# Patient Record
Sex: Male | Born: 1940 | Race: White | Hispanic: No | Marital: Married | Smoking: Never smoker
Health system: Southern US, Community
[De-identification: ages and names within clinical notes are randomized; demographics above are authoritative.]

## PROBLEM LIST (undated history)

## (undated) DIAGNOSIS — E119 Type 2 diabetes mellitus without complications: Secondary | ICD-10-CM

## (undated) DIAGNOSIS — I2510 Atherosclerotic heart disease of native coronary artery without angina pectoris: Principal | ICD-10-CM

## (undated) DIAGNOSIS — E785 Hyperlipidemia, unspecified: Secondary | ICD-10-CM

## (undated) DIAGNOSIS — I200 Unstable angina: Secondary | ICD-10-CM

## (undated) DIAGNOSIS — I10 Essential (primary) hypertension: Secondary | ICD-10-CM

## (undated) DIAGNOSIS — R079 Chest pain, unspecified: Secondary | ICD-10-CM

## (undated) HISTORY — PX: APPENDECTOMY: SHX54

## (undated) HISTORY — PX: HERNIA REPAIR: SHX51

## (undated) HISTORY — DX: Unstable angina: I20.0

## (undated) HISTORY — PX: CARDIAC CATHETERIZATION: SHX172

## (undated) HISTORY — DX: Chest pain, unspecified: R07.9

## (undated) HISTORY — DX: Hyperlipidemia, unspecified: E78.5

## (undated) HISTORY — DX: Essential (primary) hypertension: I10

## (undated) HISTORY — PX: TONSILLECTOMY: SUR1361

## (undated) HISTORY — DX: Type 2 diabetes mellitus without complications: E11.9

## (undated) HISTORY — DX: Atherosclerotic heart disease of native coronary artery without angina pectoris: I25.10

---

## 2015-03-14 DIAGNOSIS — D510 Vitamin B12 deficiency anemia due to intrinsic factor deficiency: Secondary | ICD-10-CM | POA: Diagnosis not present

## 2015-03-21 DIAGNOSIS — J101 Influenza due to other identified influenza virus with other respiratory manifestations: Secondary | ICD-10-CM | POA: Diagnosis not present

## 2015-05-03 DIAGNOSIS — D510 Vitamin B12 deficiency anemia due to intrinsic factor deficiency: Secondary | ICD-10-CM | POA: Diagnosis not present

## 2015-06-06 DIAGNOSIS — D510 Vitamin B12 deficiency anemia due to intrinsic factor deficiency: Secondary | ICD-10-CM | POA: Diagnosis not present

## 2015-06-27 DIAGNOSIS — N410 Acute prostatitis: Secondary | ICD-10-CM | POA: Diagnosis not present

## 2015-06-27 DIAGNOSIS — N3020 Other chronic cystitis without hematuria: Secondary | ICD-10-CM | POA: Diagnosis not present

## 2015-07-06 DIAGNOSIS — D510 Vitamin B12 deficiency anemia due to intrinsic factor deficiency: Secondary | ICD-10-CM | POA: Diagnosis not present

## 2015-08-10 DIAGNOSIS — D510 Vitamin B12 deficiency anemia due to intrinsic factor deficiency: Secondary | ICD-10-CM | POA: Diagnosis not present

## 2015-08-18 DIAGNOSIS — Z1389 Encounter for screening for other disorder: Secondary | ICD-10-CM | POA: Diagnosis not present

## 2015-08-18 DIAGNOSIS — M67911 Unspecified disorder of synovium and tendon, right shoulder: Secondary | ICD-10-CM | POA: Diagnosis not present

## 2015-08-18 DIAGNOSIS — R079 Chest pain, unspecified: Secondary | ICD-10-CM | POA: Diagnosis not present

## 2015-08-18 DIAGNOSIS — Z9181 History of falling: Secondary | ICD-10-CM | POA: Diagnosis not present

## 2015-08-24 DIAGNOSIS — Z7982 Long term (current) use of aspirin: Secondary | ICD-10-CM | POA: Diagnosis not present

## 2015-08-24 DIAGNOSIS — E039 Hypothyroidism, unspecified: Secondary | ICD-10-CM | POA: Diagnosis not present

## 2015-08-24 DIAGNOSIS — E7800 Pure hypercholesterolemia, unspecified: Secondary | ICD-10-CM | POA: Diagnosis not present

## 2015-08-24 DIAGNOSIS — R079 Chest pain, unspecified: Secondary | ICD-10-CM | POA: Diagnosis not present

## 2015-08-24 DIAGNOSIS — I2510 Atherosclerotic heart disease of native coronary artery without angina pectoris: Secondary | ICD-10-CM | POA: Diagnosis not present

## 2015-08-24 DIAGNOSIS — Z7984 Long term (current) use of oral hypoglycemic drugs: Secondary | ICD-10-CM | POA: Diagnosis not present

## 2015-08-24 DIAGNOSIS — E785 Hyperlipidemia, unspecified: Secondary | ICD-10-CM | POA: Diagnosis not present

## 2015-08-24 DIAGNOSIS — Z79899 Other long term (current) drug therapy: Secondary | ICD-10-CM | POA: Diagnosis not present

## 2015-08-24 DIAGNOSIS — E119 Type 2 diabetes mellitus without complications: Secondary | ICD-10-CM | POA: Diagnosis not present

## 2015-08-24 DIAGNOSIS — I700 Atherosclerosis of aorta: Secondary | ICD-10-CM | POA: Diagnosis not present

## 2015-08-24 DIAGNOSIS — E784 Other hyperlipidemia: Secondary | ICD-10-CM | POA: Diagnosis not present

## 2015-08-24 DIAGNOSIS — I200 Unstable angina: Secondary | ICD-10-CM | POA: Diagnosis not present

## 2015-08-24 DIAGNOSIS — I209 Angina pectoris, unspecified: Secondary | ICD-10-CM | POA: Diagnosis not present

## 2015-08-24 DIAGNOSIS — Z881 Allergy status to other antibiotic agents status: Secondary | ICD-10-CM | POA: Diagnosis not present

## 2015-08-25 DIAGNOSIS — Z7982 Long term (current) use of aspirin: Secondary | ICD-10-CM | POA: Diagnosis not present

## 2015-08-25 DIAGNOSIS — E039 Hypothyroidism, unspecified: Secondary | ICD-10-CM | POA: Diagnosis not present

## 2015-08-25 DIAGNOSIS — Z7984 Long term (current) use of oral hypoglycemic drugs: Secondary | ICD-10-CM | POA: Diagnosis not present

## 2015-08-25 DIAGNOSIS — E119 Type 2 diabetes mellitus without complications: Secondary | ICD-10-CM | POA: Diagnosis not present

## 2015-08-25 DIAGNOSIS — E785 Hyperlipidemia, unspecified: Secondary | ICD-10-CM | POA: Diagnosis not present

## 2015-08-25 DIAGNOSIS — I200 Unstable angina: Secondary | ICD-10-CM | POA: Diagnosis not present

## 2015-08-25 DIAGNOSIS — R079 Chest pain, unspecified: Secondary | ICD-10-CM

## 2015-08-25 DIAGNOSIS — I2510 Atherosclerotic heart disease of native coronary artery without angina pectoris: Secondary | ICD-10-CM | POA: Diagnosis not present

## 2015-08-25 DIAGNOSIS — E784 Other hyperlipidemia: Secondary | ICD-10-CM | POA: Diagnosis not present

## 2015-08-25 DIAGNOSIS — Z79899 Other long term (current) drug therapy: Secondary | ICD-10-CM | POA: Diagnosis not present

## 2015-08-25 HISTORY — DX: Chest pain, unspecified: R07.9

## 2015-08-31 DIAGNOSIS — R079 Chest pain, unspecified: Secondary | ICD-10-CM | POA: Insufficient documentation

## 2015-08-31 DIAGNOSIS — I200 Unstable angina: Secondary | ICD-10-CM | POA: Insufficient documentation

## 2015-08-31 DIAGNOSIS — E785 Hyperlipidemia, unspecified: Secondary | ICD-10-CM

## 2015-08-31 DIAGNOSIS — E119 Type 2 diabetes mellitus without complications: Secondary | ICD-10-CM

## 2015-08-31 HISTORY — DX: Type 2 diabetes mellitus without complications: E11.9

## 2015-08-31 HISTORY — DX: Hyperlipidemia, unspecified: E78.5

## 2015-08-31 HISTORY — DX: Unstable angina: I20.0

## 2015-09-01 DIAGNOSIS — I200 Unstable angina: Secondary | ICD-10-CM | POA: Diagnosis not present

## 2015-09-13 DIAGNOSIS — D510 Vitamin B12 deficiency anemia due to intrinsic factor deficiency: Secondary | ICD-10-CM | POA: Diagnosis not present

## 2015-09-29 DIAGNOSIS — E039 Hypothyroidism, unspecified: Secondary | ICD-10-CM | POA: Diagnosis not present

## 2015-09-29 DIAGNOSIS — E1165 Type 2 diabetes mellitus with hyperglycemia: Secondary | ICD-10-CM | POA: Diagnosis not present

## 2015-09-29 DIAGNOSIS — Z125 Encounter for screening for malignant neoplasm of prostate: Secondary | ICD-10-CM | POA: Diagnosis not present

## 2015-09-29 DIAGNOSIS — E782 Mixed hyperlipidemia: Secondary | ICD-10-CM | POA: Diagnosis not present

## 2015-09-30 DIAGNOSIS — S83401A Sprain of unspecified collateral ligament of right knee, initial encounter: Secondary | ICD-10-CM | POA: Diagnosis not present

## 2015-09-30 DIAGNOSIS — M67911 Unspecified disorder of synovium and tendon, right shoulder: Secondary | ICD-10-CM | POA: Diagnosis not present

## 2015-10-06 DIAGNOSIS — E1165 Type 2 diabetes mellitus with hyperglycemia: Secondary | ICD-10-CM | POA: Diagnosis not present

## 2015-10-19 DIAGNOSIS — M25561 Pain in right knee: Secondary | ICD-10-CM | POA: Diagnosis not present

## 2015-10-28 DIAGNOSIS — M25561 Pain in right knee: Secondary | ICD-10-CM | POA: Diagnosis not present

## 2015-10-31 DIAGNOSIS — S83241A Other tear of medial meniscus, current injury, right knee, initial encounter: Secondary | ICD-10-CM | POA: Diagnosis not present

## 2015-10-31 DIAGNOSIS — M1711 Unilateral primary osteoarthritis, right knee: Secondary | ICD-10-CM | POA: Diagnosis not present

## 2015-11-03 DIAGNOSIS — Z01818 Encounter for other preprocedural examination: Secondary | ICD-10-CM | POA: Diagnosis not present

## 2015-11-10 DIAGNOSIS — D510 Vitamin B12 deficiency anemia due to intrinsic factor deficiency: Secondary | ICD-10-CM | POA: Diagnosis not present

## 2015-11-28 DIAGNOSIS — M1711 Unilateral primary osteoarthritis, right knee: Secondary | ICD-10-CM | POA: Diagnosis not present

## 2015-11-28 DIAGNOSIS — S83241A Other tear of medial meniscus, current injury, right knee, initial encounter: Secondary | ICD-10-CM | POA: Diagnosis not present

## 2015-11-28 DIAGNOSIS — M25561 Pain in right knee: Secondary | ICD-10-CM | POA: Diagnosis not present

## 2015-12-01 DIAGNOSIS — Z79899 Other long term (current) drug therapy: Secondary | ICD-10-CM | POA: Diagnosis not present

## 2015-12-01 DIAGNOSIS — M65861 Other synovitis and tenosynovitis, right lower leg: Secondary | ICD-10-CM | POA: Diagnosis not present

## 2015-12-01 DIAGNOSIS — Z7984 Long term (current) use of oral hypoglycemic drugs: Secondary | ICD-10-CM | POA: Diagnosis not present

## 2015-12-01 DIAGNOSIS — M6751 Plica syndrome, right knee: Secondary | ICD-10-CM | POA: Diagnosis not present

## 2015-12-01 DIAGNOSIS — E039 Hypothyroidism, unspecified: Secondary | ICD-10-CM | POA: Diagnosis not present

## 2015-12-01 DIAGNOSIS — E785 Hyperlipidemia, unspecified: Secondary | ICD-10-CM | POA: Diagnosis not present

## 2015-12-01 DIAGNOSIS — N400 Enlarged prostate without lower urinary tract symptoms: Secondary | ICD-10-CM | POA: Diagnosis not present

## 2015-12-01 DIAGNOSIS — S83241A Other tear of medial meniscus, current injury, right knee, initial encounter: Secondary | ICD-10-CM | POA: Diagnosis not present

## 2015-12-01 DIAGNOSIS — E119 Type 2 diabetes mellitus without complications: Secondary | ICD-10-CM | POA: Diagnosis not present

## 2015-12-05 DIAGNOSIS — S83241D Other tear of medial meniscus, current injury, right knee, subsequent encounter: Secondary | ICD-10-CM | POA: Diagnosis not present

## 2015-12-05 DIAGNOSIS — R2689 Other abnormalities of gait and mobility: Secondary | ICD-10-CM | POA: Diagnosis not present

## 2015-12-05 DIAGNOSIS — M1711 Unilateral primary osteoarthritis, right knee: Secondary | ICD-10-CM | POA: Diagnosis not present

## 2015-12-05 DIAGNOSIS — M25561 Pain in right knee: Secondary | ICD-10-CM | POA: Diagnosis not present

## 2015-12-07 DIAGNOSIS — M25561 Pain in right knee: Secondary | ICD-10-CM | POA: Diagnosis not present

## 2015-12-07 DIAGNOSIS — M1711 Unilateral primary osteoarthritis, right knee: Secondary | ICD-10-CM | POA: Diagnosis not present

## 2015-12-07 DIAGNOSIS — S83241D Other tear of medial meniscus, current injury, right knee, subsequent encounter: Secondary | ICD-10-CM | POA: Diagnosis not present

## 2015-12-07 DIAGNOSIS — R2689 Other abnormalities of gait and mobility: Secondary | ICD-10-CM | POA: Diagnosis not present

## 2015-12-12 DIAGNOSIS — S83241D Other tear of medial meniscus, current injury, right knee, subsequent encounter: Secondary | ICD-10-CM | POA: Diagnosis not present

## 2015-12-12 DIAGNOSIS — M25561 Pain in right knee: Secondary | ICD-10-CM | POA: Diagnosis not present

## 2015-12-12 DIAGNOSIS — M1711 Unilateral primary osteoarthritis, right knee: Secondary | ICD-10-CM | POA: Diagnosis not present

## 2015-12-12 DIAGNOSIS — R2689 Other abnormalities of gait and mobility: Secondary | ICD-10-CM | POA: Diagnosis not present

## 2015-12-21 DIAGNOSIS — M1711 Unilateral primary osteoarthritis, right knee: Secondary | ICD-10-CM | POA: Diagnosis not present

## 2015-12-21 DIAGNOSIS — M25561 Pain in right knee: Secondary | ICD-10-CM | POA: Diagnosis not present

## 2015-12-21 DIAGNOSIS — S83241D Other tear of medial meniscus, current injury, right knee, subsequent encounter: Secondary | ICD-10-CM | POA: Diagnosis not present

## 2015-12-21 DIAGNOSIS — R2689 Other abnormalities of gait and mobility: Secondary | ICD-10-CM | POA: Diagnosis not present

## 2016-01-03 DIAGNOSIS — M1711 Unilateral primary osteoarthritis, right knee: Secondary | ICD-10-CM | POA: Diagnosis not present

## 2016-01-03 DIAGNOSIS — M25561 Pain in right knee: Secondary | ICD-10-CM | POA: Diagnosis not present

## 2016-01-03 DIAGNOSIS — R2689 Other abnormalities of gait and mobility: Secondary | ICD-10-CM | POA: Diagnosis not present

## 2016-01-03 DIAGNOSIS — S83241D Other tear of medial meniscus, current injury, right knee, subsequent encounter: Secondary | ICD-10-CM | POA: Diagnosis not present

## 2016-02-16 DIAGNOSIS — J0100 Acute maxillary sinusitis, unspecified: Secondary | ICD-10-CM | POA: Diagnosis not present

## 2016-02-16 DIAGNOSIS — H6982 Other specified disorders of Eustachian tube, left ear: Secondary | ICD-10-CM | POA: Diagnosis not present

## 2016-02-24 DIAGNOSIS — D510 Vitamin B12 deficiency anemia due to intrinsic factor deficiency: Secondary | ICD-10-CM | POA: Diagnosis not present

## 2016-03-30 DIAGNOSIS — D510 Vitamin B12 deficiency anemia due to intrinsic factor deficiency: Secondary | ICD-10-CM | POA: Diagnosis not present

## 2016-04-12 DIAGNOSIS — E1165 Type 2 diabetes mellitus with hyperglycemia: Secondary | ICD-10-CM | POA: Diagnosis not present

## 2016-04-12 DIAGNOSIS — E039 Hypothyroidism, unspecified: Secondary | ICD-10-CM | POA: Diagnosis not present

## 2016-04-12 DIAGNOSIS — E782 Mixed hyperlipidemia: Secondary | ICD-10-CM | POA: Diagnosis not present

## 2016-04-12 DIAGNOSIS — Z79899 Other long term (current) drug therapy: Secondary | ICD-10-CM | POA: Diagnosis not present

## 2016-04-19 DIAGNOSIS — I201 Angina pectoris with documented spasm: Secondary | ICD-10-CM | POA: Diagnosis not present

## 2016-04-19 DIAGNOSIS — E119 Type 2 diabetes mellitus without complications: Secondary | ICD-10-CM | POA: Diagnosis not present

## 2016-04-19 DIAGNOSIS — E039 Hypothyroidism, unspecified: Secondary | ICD-10-CM | POA: Diagnosis not present

## 2016-05-03 DIAGNOSIS — D510 Vitamin B12 deficiency anemia due to intrinsic factor deficiency: Secondary | ICD-10-CM | POA: Diagnosis not present

## 2016-06-04 DIAGNOSIS — D510 Vitamin B12 deficiency anemia due to intrinsic factor deficiency: Secondary | ICD-10-CM | POA: Diagnosis not present

## 2016-07-06 DIAGNOSIS — D510 Vitamin B12 deficiency anemia due to intrinsic factor deficiency: Secondary | ICD-10-CM | POA: Diagnosis not present

## 2016-07-13 DIAGNOSIS — I201 Angina pectoris with documented spasm: Secondary | ICD-10-CM | POA: Diagnosis not present

## 2016-07-13 DIAGNOSIS — Z6825 Body mass index (BMI) 25.0-25.9, adult: Secondary | ICD-10-CM | POA: Diagnosis not present

## 2016-08-10 DIAGNOSIS — D510 Vitamin B12 deficiency anemia due to intrinsic factor deficiency: Secondary | ICD-10-CM | POA: Diagnosis not present

## 2016-09-10 DIAGNOSIS — E119 Type 2 diabetes mellitus without complications: Secondary | ICD-10-CM | POA: Diagnosis not present

## 2016-09-10 DIAGNOSIS — E039 Hypothyroidism, unspecified: Secondary | ICD-10-CM | POA: Diagnosis not present

## 2016-09-14 DIAGNOSIS — Z6825 Body mass index (BMI) 25.0-25.9, adult: Secondary | ICD-10-CM | POA: Diagnosis not present

## 2016-09-14 DIAGNOSIS — E119 Type 2 diabetes mellitus without complications: Secondary | ICD-10-CM | POA: Diagnosis not present

## 2016-09-14 DIAGNOSIS — R1011 Right upper quadrant pain: Secondary | ICD-10-CM | POA: Diagnosis not present

## 2016-09-14 DIAGNOSIS — Z0000 Encounter for general adult medical examination without abnormal findings: Secondary | ICD-10-CM | POA: Diagnosis not present

## 2016-09-24 DIAGNOSIS — R1011 Right upper quadrant pain: Secondary | ICD-10-CM | POA: Diagnosis not present

## 2016-09-24 DIAGNOSIS — N281 Cyst of kidney, acquired: Secondary | ICD-10-CM | POA: Diagnosis not present

## 2016-10-12 DIAGNOSIS — D510 Vitamin B12 deficiency anemia due to intrinsic factor deficiency: Secondary | ICD-10-CM | POA: Diagnosis not present

## 2016-11-22 DIAGNOSIS — M25511 Pain in right shoulder: Secondary | ICD-10-CM | POA: Diagnosis not present

## 2016-11-22 DIAGNOSIS — Z6826 Body mass index (BMI) 26.0-26.9, adult: Secondary | ICD-10-CM | POA: Diagnosis not present

## 2016-11-22 DIAGNOSIS — M5010 Cervical disc disorder with radiculopathy, unspecified cervical region: Secondary | ICD-10-CM | POA: Diagnosis not present

## 2016-11-22 DIAGNOSIS — M25512 Pain in left shoulder: Secondary | ICD-10-CM | POA: Diagnosis not present

## 2017-01-01 DIAGNOSIS — D510 Vitamin B12 deficiency anemia due to intrinsic factor deficiency: Secondary | ICD-10-CM | POA: Diagnosis not present

## 2017-02-01 DIAGNOSIS — D510 Vitamin B12 deficiency anemia due to intrinsic factor deficiency: Secondary | ICD-10-CM | POA: Diagnosis not present

## 2017-02-15 DIAGNOSIS — H52222 Regular astigmatism, left eye: Secondary | ICD-10-CM | POA: Diagnosis not present

## 2017-02-15 DIAGNOSIS — Z7984 Long term (current) use of oral hypoglycemic drugs: Secondary | ICD-10-CM | POA: Diagnosis not present

## 2017-02-15 DIAGNOSIS — H5203 Hypermetropia, bilateral: Secondary | ICD-10-CM | POA: Diagnosis not present

## 2017-02-15 DIAGNOSIS — E119 Type 2 diabetes mellitus without complications: Secondary | ICD-10-CM | POA: Diagnosis not present

## 2017-03-08 DIAGNOSIS — D510 Vitamin B12 deficiency anemia due to intrinsic factor deficiency: Secondary | ICD-10-CM | POA: Diagnosis not present

## 2017-04-15 DIAGNOSIS — E039 Hypothyroidism, unspecified: Secondary | ICD-10-CM | POA: Diagnosis not present

## 2017-04-15 DIAGNOSIS — E119 Type 2 diabetes mellitus without complications: Secondary | ICD-10-CM | POA: Diagnosis not present

## 2017-04-15 DIAGNOSIS — D510 Vitamin B12 deficiency anemia due to intrinsic factor deficiency: Secondary | ICD-10-CM | POA: Diagnosis not present

## 2017-04-22 DIAGNOSIS — Z6825 Body mass index (BMI) 25.0-25.9, adult: Secondary | ICD-10-CM | POA: Diagnosis not present

## 2017-04-22 DIAGNOSIS — E119 Type 2 diabetes mellitus without complications: Secondary | ICD-10-CM | POA: Diagnosis not present

## 2017-04-22 DIAGNOSIS — I201 Angina pectoris with documented spasm: Secondary | ICD-10-CM | POA: Diagnosis not present

## 2017-05-21 DIAGNOSIS — Z6825 Body mass index (BMI) 25.0-25.9, adult: Secondary | ICD-10-CM | POA: Diagnosis not present

## 2017-05-21 DIAGNOSIS — J209 Acute bronchitis, unspecified: Secondary | ICD-10-CM | POA: Diagnosis not present

## 2017-05-21 DIAGNOSIS — D510 Vitamin B12 deficiency anemia due to intrinsic factor deficiency: Secondary | ICD-10-CM | POA: Diagnosis not present

## 2017-06-03 DIAGNOSIS — R531 Weakness: Secondary | ICD-10-CM | POA: Diagnosis not present

## 2017-06-03 DIAGNOSIS — H538 Other visual disturbances: Secondary | ICD-10-CM | POA: Diagnosis not present

## 2017-06-03 DIAGNOSIS — Z79899 Other long term (current) drug therapy: Secondary | ICD-10-CM | POA: Diagnosis not present

## 2017-06-03 DIAGNOSIS — R42 Dizziness and giddiness: Secondary | ICD-10-CM | POA: Diagnosis not present

## 2017-06-03 DIAGNOSIS — E11649 Type 2 diabetes mellitus with hypoglycemia without coma: Secondary | ICD-10-CM | POA: Diagnosis not present

## 2017-06-03 DIAGNOSIS — Z7984 Long term (current) use of oral hypoglycemic drugs: Secondary | ICD-10-CM | POA: Diagnosis not present

## 2017-06-03 DIAGNOSIS — R262 Difficulty in walking, not elsewhere classified: Secondary | ICD-10-CM | POA: Diagnosis not present

## 2017-06-03 DIAGNOSIS — R29700 NIHSS score 0: Secondary | ICD-10-CM | POA: Diagnosis not present

## 2017-06-03 DIAGNOSIS — Z7982 Long term (current) use of aspirin: Secondary | ICD-10-CM | POA: Diagnosis not present

## 2017-06-03 DIAGNOSIS — H532 Diplopia: Secondary | ICD-10-CM | POA: Diagnosis not present

## 2017-06-17 DIAGNOSIS — D510 Vitamin B12 deficiency anemia due to intrinsic factor deficiency: Secondary | ICD-10-CM | POA: Diagnosis not present

## 2017-06-20 DIAGNOSIS — E782 Mixed hyperlipidemia: Secondary | ICD-10-CM | POA: Diagnosis not present

## 2017-06-20 DIAGNOSIS — R002 Palpitations: Secondary | ICD-10-CM | POA: Diagnosis not present

## 2017-06-20 DIAGNOSIS — I201 Angina pectoris with documented spasm: Secondary | ICD-10-CM | POA: Diagnosis not present

## 2017-06-20 DIAGNOSIS — D492 Neoplasm of unspecified behavior of bone, soft tissue, and skin: Secondary | ICD-10-CM | POA: Diagnosis not present

## 2017-07-11 DIAGNOSIS — Z6825 Body mass index (BMI) 25.0-25.9, adult: Secondary | ICD-10-CM | POA: Diagnosis not present

## 2017-07-11 DIAGNOSIS — L570 Actinic keratosis: Secondary | ICD-10-CM | POA: Diagnosis not present

## 2017-07-11 DIAGNOSIS — D492 Neoplasm of unspecified behavior of bone, soft tissue, and skin: Secondary | ICD-10-CM | POA: Diagnosis not present

## 2017-07-11 DIAGNOSIS — I201 Angina pectoris with documented spasm: Secondary | ICD-10-CM | POA: Diagnosis not present

## 2017-08-01 DIAGNOSIS — D510 Vitamin B12 deficiency anemia due to intrinsic factor deficiency: Secondary | ICD-10-CM | POA: Diagnosis not present

## 2017-08-05 DIAGNOSIS — D039 Melanoma in situ, unspecified: Secondary | ICD-10-CM | POA: Diagnosis not present

## 2017-08-06 DIAGNOSIS — L989 Disorder of the skin and subcutaneous tissue, unspecified: Secondary | ICD-10-CM | POA: Diagnosis not present

## 2017-09-04 DIAGNOSIS — E119 Type 2 diabetes mellitus without complications: Secondary | ICD-10-CM | POA: Diagnosis not present

## 2017-09-04 DIAGNOSIS — E039 Hypothyroidism, unspecified: Secondary | ICD-10-CM | POA: Diagnosis not present

## 2017-09-09 DIAGNOSIS — H6090 Unspecified otitis externa, unspecified ear: Secondary | ICD-10-CM | POA: Diagnosis not present

## 2017-09-09 DIAGNOSIS — E119 Type 2 diabetes mellitus without complications: Secondary | ICD-10-CM | POA: Diagnosis not present

## 2017-09-09 DIAGNOSIS — M26609 Unspecified temporomandibular joint disorder, unspecified side: Secondary | ICD-10-CM | POA: Diagnosis not present

## 2017-09-09 DIAGNOSIS — E039 Hypothyroidism, unspecified: Secondary | ICD-10-CM | POA: Diagnosis not present

## 2017-09-09 DIAGNOSIS — H6981 Other specified disorders of Eustachian tube, right ear: Secondary | ICD-10-CM | POA: Diagnosis not present

## 2017-09-09 DIAGNOSIS — J309 Allergic rhinitis, unspecified: Secondary | ICD-10-CM | POA: Diagnosis not present

## 2017-09-10 DIAGNOSIS — L578 Other skin changes due to chronic exposure to nonionizing radiation: Secondary | ICD-10-CM | POA: Diagnosis not present

## 2017-09-10 DIAGNOSIS — Z85820 Personal history of malignant melanoma of skin: Secondary | ICD-10-CM | POA: Diagnosis not present

## 2017-09-10 DIAGNOSIS — L570 Actinic keratosis: Secondary | ICD-10-CM | POA: Diagnosis not present

## 2017-09-17 DIAGNOSIS — D485 Neoplasm of uncertain behavior of skin: Secondary | ICD-10-CM | POA: Diagnosis not present

## 2017-09-18 DIAGNOSIS — E118 Type 2 diabetes mellitus with unspecified complications: Secondary | ICD-10-CM | POA: Diagnosis not present

## 2017-09-18 DIAGNOSIS — Z23 Encounter for immunization: Secondary | ICD-10-CM | POA: Diagnosis not present

## 2017-09-18 DIAGNOSIS — Z6825 Body mass index (BMI) 25.0-25.9, adult: Secondary | ICD-10-CM | POA: Diagnosis not present

## 2017-09-18 DIAGNOSIS — Z1331 Encounter for screening for depression: Secondary | ICD-10-CM | POA: Diagnosis not present

## 2017-10-03 DIAGNOSIS — E039 Hypothyroidism, unspecified: Secondary | ICD-10-CM | POA: Diagnosis not present

## 2017-10-03 DIAGNOSIS — E785 Hyperlipidemia, unspecified: Secondary | ICD-10-CM | POA: Diagnosis not present

## 2017-10-03 DIAGNOSIS — E118 Type 2 diabetes mellitus with unspecified complications: Secondary | ICD-10-CM | POA: Diagnosis not present

## 2017-10-25 DIAGNOSIS — D510 Vitamin B12 deficiency anemia due to intrinsic factor deficiency: Secondary | ICD-10-CM | POA: Diagnosis not present

## 2017-10-28 DIAGNOSIS — D485 Neoplasm of uncertain behavior of skin: Secondary | ICD-10-CM | POA: Diagnosis not present

## 2017-11-04 DIAGNOSIS — N400 Enlarged prostate without lower urinary tract symptoms: Secondary | ICD-10-CM | POA: Diagnosis not present

## 2017-11-04 DIAGNOSIS — E039 Hypothyroidism, unspecified: Secondary | ICD-10-CM | POA: Diagnosis not present

## 2017-11-04 DIAGNOSIS — E119 Type 2 diabetes mellitus without complications: Secondary | ICD-10-CM | POA: Diagnosis not present

## 2017-11-04 DIAGNOSIS — E782 Mixed hyperlipidemia: Secondary | ICD-10-CM | POA: Diagnosis not present

## 2017-12-05 DIAGNOSIS — E782 Mixed hyperlipidemia: Secondary | ICD-10-CM | POA: Diagnosis not present

## 2017-12-05 DIAGNOSIS — E119 Type 2 diabetes mellitus without complications: Secondary | ICD-10-CM | POA: Diagnosis not present

## 2017-12-19 DIAGNOSIS — D510 Vitamin B12 deficiency anemia due to intrinsic factor deficiency: Secondary | ICD-10-CM | POA: Diagnosis not present

## 2017-12-31 DIAGNOSIS — E119 Type 2 diabetes mellitus without complications: Secondary | ICD-10-CM | POA: Diagnosis not present

## 2017-12-31 DIAGNOSIS — Z125 Encounter for screening for malignant neoplasm of prostate: Secondary | ICD-10-CM | POA: Diagnosis not present

## 2017-12-31 DIAGNOSIS — E782 Mixed hyperlipidemia: Secondary | ICD-10-CM | POA: Diagnosis not present

## 2018-01-08 DIAGNOSIS — Z6825 Body mass index (BMI) 25.0-25.9, adult: Secondary | ICD-10-CM | POA: Diagnosis not present

## 2018-01-08 DIAGNOSIS — E039 Hypothyroidism, unspecified: Secondary | ICD-10-CM | POA: Diagnosis not present

## 2018-01-08 DIAGNOSIS — E1165 Type 2 diabetes mellitus with hyperglycemia: Secondary | ICD-10-CM | POA: Diagnosis not present

## 2018-01-08 DIAGNOSIS — E118 Type 2 diabetes mellitus with unspecified complications: Secondary | ICD-10-CM | POA: Diagnosis not present

## 2018-01-13 DIAGNOSIS — N3020 Other chronic cystitis without hematuria: Secondary | ICD-10-CM | POA: Diagnosis not present

## 2018-01-13 DIAGNOSIS — Z681 Body mass index (BMI) 19 or less, adult: Secondary | ICD-10-CM | POA: Diagnosis not present

## 2018-01-13 DIAGNOSIS — N451 Epididymitis: Secondary | ICD-10-CM | POA: Diagnosis not present

## 2018-01-27 DIAGNOSIS — N401 Enlarged prostate with lower urinary tract symptoms: Secondary | ICD-10-CM | POA: Diagnosis not present

## 2018-01-27 DIAGNOSIS — N451 Epididymitis: Secondary | ICD-10-CM | POA: Diagnosis not present

## 2018-01-27 DIAGNOSIS — N3020 Other chronic cystitis without hematuria: Secondary | ICD-10-CM | POA: Diagnosis not present

## 2018-01-27 DIAGNOSIS — N318 Other neuromuscular dysfunction of bladder: Secondary | ICD-10-CM | POA: Diagnosis not present

## 2018-02-12 DIAGNOSIS — Z6826 Body mass index (BMI) 26.0-26.9, adult: Secondary | ICD-10-CM | POA: Diagnosis not present

## 2018-02-12 DIAGNOSIS — I201 Angina pectoris with documented spasm: Secondary | ICD-10-CM | POA: Diagnosis not present

## 2018-02-27 ENCOUNTER — Ambulatory Visit: Payer: Medicare Other | Admitting: Cardiology

## 2018-02-27 VITALS — BP 120/74 | HR 62 | Ht 72.0 in | Wt 200.6 lb

## 2018-02-27 DIAGNOSIS — I2510 Atherosclerotic heart disease of native coronary artery without angina pectoris: Secondary | ICD-10-CM | POA: Insufficient documentation

## 2018-02-27 DIAGNOSIS — E119 Type 2 diabetes mellitus without complications: Secondary | ICD-10-CM

## 2018-02-27 DIAGNOSIS — I200 Unstable angina: Secondary | ICD-10-CM | POA: Diagnosis not present

## 2018-02-27 DIAGNOSIS — E782 Mixed hyperlipidemia: Secondary | ICD-10-CM

## 2018-02-27 DIAGNOSIS — Z01812 Encounter for preprocedural laboratory examination: Secondary | ICD-10-CM

## 2018-02-27 DIAGNOSIS — I10 Essential (primary) hypertension: Secondary | ICD-10-CM

## 2018-02-27 HISTORY — DX: Atherosclerotic heart disease of native coronary artery without angina pectoris: I25.10

## 2018-02-27 HISTORY — DX: Essential (primary) hypertension: I10

## 2018-02-27 MED ORDER — METOPROLOL TARTRATE 50 MG PO TABS: 50.0000 mg | ORAL_TABLET | Freq: Once | ORAL | 0 refills | Status: DC

## 2018-02-27 NOTE — Patient Instructions (Addendum)
Medication Instructions:  Your physician recommends that you continue on your current medications as directed. Please refer to the Current Medication list given to you today.   If you need a refill on your cardiac medications before your next appointment, please call your pharmacy.   Lab work: Your physician recommends that you return for lab work today: BMP, troponin I.   Your physician recommends that you return for lab work within 3-7 days before your cardiac CTA: BMP. Please return to our office for lab work, no appointment needed. No need to fast beforehand.   If you have labs (blood work) drawn today and your tests are completely normal, you will receive your results only by: Marland Kitchen MyChart Message (if you have MyChart) OR . A paper copy in the mail If you have any lab test that is abnormal or we need to change your treatment, we will call you to review the results.  Testing/Procedures: You had an EKG today.   Your physician has requested that you have cardiac CT. Cardiac computed tomography (CT) is a painless test that uses an x-ray machine to take clear, detailed pictures of your heart. For further information please visit [email or web address] Please follow instruction sheet as given.  Please arrive at the [facility] main entrance of [facility] at xx:xx AM (30-45 minutes prior to test start time)  [facility] [address] [place], [place] [address] [phone]  Proceed to the [facility] Radiology Department (First Floor).  Please follow these instructions carefully (unless otherwise directed):  Hold all erectile dysfunction medications at least 48 hours prior to test.  On the Night Before the Test: . Be sure to Drink plenty of water. . Do not consume any caffeinated/decaffeinated beverages or chocolate 12 hours prior to your test. . Do not take any antihistamines 12 hours prior to your test. . If you take Metformin do not take 24 hours prior to  test.  On the Day of the Test: . Drink plenty of water. Do not drink any water within one hour of the test. . Do not eat any food 4 hours prior to the test. . You may take your regular medications prior to the test.  . Take metoprolol (Lopressor) two hours prior to test.                  -If HR is less than 55 BPM- No Beta Blocker                -IF HR is greater than 55 BPM and patient is less than or equal to 52 yrs old Lopressor 100mg  x1.                -If HR is greater than 55 BPM and patient is greater than 50 yrs old Lopressor 50 mg x1.     After the Test: . Drink plenty of water. . After receiving IV contrast, you may experience a mild flushed feeling. This is normal. . On occasion, you may experience a mild rash up to 24 hours after the test. This is not dangerous. If this occurs, you can take Benadryl 25 mg and increase your fluid intake. . If you experience trouble breathing, this can be serious. If it is severe call 911 IMMEDIATELY. If it is mild, please call our office. . If you take any of these medications: Glipizide/Metformin, Avandament, Glucavance, please do not take 48 hours after completing test.   Follow-Up: At [facility], you  and your health needs are our priority.  As part of our continuing mission to provide you with exceptional heart care, we have created designated Provider Care Teams.  These Care Teams include your primary Cardiologist (physician) and Advanced Practice Providers (APPs -  Physician Assistants and Nurse Practitioners) who all work together to provide you with the care you need, when you need it. You will need a follow up appointment in 1 months.     Cardiac CT Angiogram  A cardiac CT angiogram is a procedure to look at the heart and the area around the heart. It may be done to help find the cause of chest pains or other symptoms of heart disease. During this procedure, a large X-ray machine, called a CT scanner, takes detailed pictures of the  heart and the surrounding area after a dye (contrast material) has been injected into blood vessels in the area. The procedure is also sometimes called a coronary CT angiogram, coronary artery scanning, or CTA. A cardiac CT angiogram allows the health care provider to see how well blood is flowing to and from the heart. The health care provider will be able to see if there are any problems, such as:  Blockage or narrowing of the coronary arteries in the heart.  Fluid around the heart.  Signs of weakness or disease in the muscles, valves, and tissues of the heart. Tell a health care provider about:  Any allergies you have. This is especially important if you have had a previous allergic reaction to contrast dye.  All medicines you are taking, including vitamins, herbs, eye drops, creams, and over-the-counter medicines.  Any blood disorders you have.  Any surgeries you have had.  Any medical conditions you have.  Whether you are pregnant or may be pregnant.  Any anxiety disorders, chronic pain, or other conditions you have that may increase your stress or prevent you from lying still. What are the risks? Generally, this is a safe procedure. However, problems may occur, including:  Bleeding.  Infection.  Allergic reactions to medicines or dyes.  Damage to other structures or organs.  Kidney damage from the dye or contrast that is used.  Increased risk of cancer from radiation exposure. This risk is low. Talk with your health care provider about: ? The risks and benefits of testing. ? How you can receive the lowest dose of radiation. What happens before the procedure?  Wear comfortable clothing and remove any jewelry, glasses, dentures, and hearing aids.  Follow instructions from your health care provider about eating and drinking. This may include: ? For 12 hours before the test - avoid caffeine. This includes tea, coffee, soda, energy drinks, and diet pills. Drink plenty of  water or other fluids that do not have caffeine in them. Being well-hydrated can prevent complications. ? For 4-6 hours before the test - stop eating and drinking. The contrast dye can cause nausea, but this is less likely if your stomach is empty.  Ask your health care provider about changing or stopping your regular medicines. This is especially important if you are taking diabetes medicines, blood thinners, or medicines to treat erectile dysfunction. What happens during the procedure?  Hair on your chest may need to be removed so that small sticky patches called electrodes can be placed on your chest. These will transmit information that helps to monitor your heart during the test.  An IV tube will be inserted into one of your veins.  You might be given a medicine  to control your heart rate during the test. This will help to ensure that good images are obtained.  You will be asked to lie on an exam table. This table will slide in and out of the CT machine during the procedure.  Contrast dye will be injected into the IV tube. You might feel warm, or you may get a metallic taste in your mouth.  You will be given a medicine (nitroglycerin) to relax (dilate) the arteries in your heart.  The table that you are lying on will move into the CT machine tunnel for the scan.  The person running the machine will give you instructions while the scans are being done. You may be asked to: ? Keep your arms above your head. ? Hold your breath. ? Stay very still, even if the table is moving.  When the scanning is complete, you will be moved out of the machine.  The IV tube will be removed. The procedure may vary among health care providers and hospitals. What happens after the procedure?  You might feel warm, or you may get a metallic taste in your mouth from the contrast dye.  You may have a headache from the nitroglycerin.  After the procedure, drink water or other fluids to wash (flush) the  contrast material out of your body.  Contact a health care provider if you have any symptoms of allergy to the contrast. These symptoms include: ? Shortness of breath. ? Rash or hives. ? A racing heartbeat.  Most people can return to their normal activities right after the procedure. Ask your health care provider what activities are safe for you.  It is up to you to get the results of your procedure. Ask your health care provider, or the department that is doing the procedure, when your results will be ready. Summary  A cardiac CT angiogram is a procedure to look at the heart and the area around the heart. It may be done to help find the cause of chest pains or other symptoms of heart disease.  During this procedure, a large X-ray machine, called a CT scanner, takes detailed pictures of the heart and the surrounding area after a dye (contrast material) has been injected into blood vessels in the area.  Ask your health care provider about changing or stopping your regular medicines before the procedure. This is especially important if you are taking diabetes medicines, blood thinners, or medicines to treat erectile dysfunction.  After the procedure, drink water or other fluids to wash (flush) the contrast material out of your body. This information is not intended to replace advice given to you by your health care provider. Make sure you discuss any questions you have with your health care provider. Document Released: 01/05/2008 Document Revised: 12/12/2015 Document Reviewed: 12/12/2015 Elsevier Interactive Patient Education  2019 [employer]

## 2018-02-27 NOTE — Progress Notes (Signed)
Cardiology Office Note:    Date:  02/27/2018   ID:  Craig Armstrong, DOB 1940-08-31, MRN [id number]  PCP:  Lise Auer, MD  Cardiologist:  Norman Herrlich, MD   Referring MD: Lise Auer, MD  ASSESSMENT:    1. Mild CAD   2. Essential hypertension   3. Mixed hyperlipidemia   4. Type 2 diabetes mellitus without complication, without long-term current use of insulin (HCC)   5. Unstable angina (HCC)   6. Pre-procedure lab exam    PLAN:    In order of problems listed above:  1. He has a history of mild nonobstructive CAD a coronary angiography 2017 has had a progression of symptoms now [place] classification 2-3 stable pattern disproportionate to CAD we discussed options for evaluation including traditional stress test referral for coronary angiography and decide on a cardiac CTA and if he has severe flow-limiting stenosis refer for repeat coronary angiography and revascularization.  Assess risk we will draw troponin today if elevated treat him as acute coronary syndrome continue his current medical treatment with aspirin and high intensity statin.  I will see back in the office in several weeks he has nitroglycerin to take as needed. 2. Stable blood pressure target continue current treatment 3. Stable managed by his PCP, ideally with coincident CAD if a second agent as needed GLP-1 like Victoza or SGLT2 inhibitors would be preferred 4. Stable hyperlipidemia continue with statin  Next appointment   Medication Adjustments/Labs and Tests Ordered: Current medicines are reviewed at length with the patient today.  Concerns regarding medicines are outlined above.  Orders Placed This Encounter  Procedures  . CT CORONARY MORPH W/CTA COR W/SCORE W/CA W/CM &/OR WO/CM  . CT CORONARY FRACTIONAL FLOW RESERVE DATA PREP  . CT CORONARY FRACTIONAL FLOW RESERVE FLUID ANALYSIS  . Basic Metabolic Panel (BMET)  . Basic Metabolic Panel (BMET)  . Troponin I  . EKG 12-Lead   Meds ordered this  encounter  Medications  . metoprolol tartrate (LOPRESSOR) 50 MG tablet    Sig: Take 1 tablet (50 mg total) by mouth once for 1 dose. Take 2 hours before your cardiac CTA.    Dispense:  2 tablet    Refill:  0     Chief Complaint  Patient presents with  . Follow-up    with mild non obstructive CAD    History of Present Illness:    Craig Armstrong is a 78 y.o. male who is being seen today for the evaluation of mild non obstructive CAD at cath 08/13/15 at the request of Lise Auer, MD.  Diagnostic Procedure Summary Mild non obstructive CAD LMCA: Mild luminal irregularities < 20% LAD: Mild luminal irregularities < 20% LCx: Mild luminal irregularities < 20% RCA: Mild luminal irregularities < 20% Normal LV function and EDP  He has had a stable pattern of exertional angina that initially occurred only with activities like pushing the lawnmower on an incline.  Symptoms progressed the last summer when he was hiking and needed to stop and rest with activities that he did previously now with any physical effort often several times a day he developed substernal pressure resolved with rest.  So far he has not had nocturnal rest rest episodes.  With knowledge that he has mild nonobstructive CAD previously coronary angiography we decided to arrange for cardiac CTA and will attempt to expedite.  We will draw a troponin today if elevated treat him like acute coronary syndrome and he has nitroglycerin and develops rest  nocturnal or prolonged episodes will contact us and present to the hospital.  He also has frequent APCs and atrial bigeminy we discussed wearing a monitor but he told me he is done in the last year and his PCP office will try to access this I do not think he is having sinus node exit block  on his EKG.  Past Medical History:  Diagnosis Date  . Chest pain 08/25/2015  . Essential hypertension 02/27/2018  . Hyperlipidemia 08/31/2015  . Mild CAD 02/27/2018  . Type 2 diabetes mellitus (HCC)  08/31/2015  . Unstable angina (HCC) 08/31/2015    Past Surgical History:  Procedure Laterality Date  . APPENDECTOMY    . CARDIAC CATHETERIZATION    . HERNIA REPAIR    . TONSILLECTOMY      Current Medications: Current Meds  Medication Sig  . aspirin EC 81 MG tablet Take 81 mg by mouth daily.  . Cholecalciferol (VITAMIN D3 PO) Take 2,000 Units by mouth daily.  . Coenzyme Q10 (EQL COQ10) 300 MG CAPS Take 1 capsule by mouth daily.  . cyanocobalamin (,VITAMIN B-12,) 1000 MCG/ML injection Inject 1,000 mcg into the muscle every 30 (thirty) days.  . Cyanocobalamin (VITAMIN B-12) 1000 MCG/15ML LIQD Take 15 mLs by mouth daily.  Marland Kitchen. levothyroxine (SYNTHROID, LEVOTHROID) 112 MCG tablet TAKE 1 TABLET BY MOUTH ONCE DAILY EXCEPT TAKE 1 2 TABLET ON SUNDAYS  . metFORMIN (GLUCOPHAGE-XR) 500 MG 24 hr tablet Take 1,000 mg by mouth 2 (two) times daily.  . nitroGLYCERIN (NITROSTAT) 0.4 MG SL tablet Place 0.4 mg under the tongue every 5 (five) minutes as needed for chest pain.  . NON FORMULARY VITAL REDS-1 scoop daily  . NON FORMULARY PROSTAGUARD- 2 tablets daily  . pioglitazone (ACTOS) 45 MG tablet Take 45 mg by mouth daily.  . rosuvastatin (CRESTOR) 5 MG tablet Take 5 mg by mouth as directed. Only takes 3 times per week  . tamsulosin (FLOMAX) 0.4 MG CAPS capsule Take 0.4 mg by mouth daily.     Allergies:   Atorvastatin; Zocor [simvastatin]; and Sulfa antibiotics   Social History   Socioeconomic History  . Marital status: Married    Spouse name: Not on file  . Number of children: Not on file  . Years of education: Not on file  . Highest education level: Not on file  Occupational History  . Not on file  Social Needs  . Financial resource strain: Not on file  . Food insecurity:    Worry: Not on file    Inability: Not on file  . Transportation needs:    Medical: Not on file    Non-medical: Not on file  Tobacco Use  . Smoking status: Never Smoker  . Smokeless tobacco: Never Used  Substance and  Sexual Activity  . Alcohol use: Not Currently  . Drug use: Not Currently  . Sexual activity: Not on file  Lifestyle  . Physical activity:    Days per week: Not on file    Minutes per session: Not on file  . Stress: Not on file  Relationships  . Social connections:    Talks on phone: Not on file    Gets together: Not on file    Attends religious service: Not on file    Active member of club or organization: Not on file    Attends meetings of clubs or organizations: Not on file    Relationship status: Not on file  Other Topics Concern  . Not on file  Social  History Narrative  . Not on file     Family History: The patient's family history includes Diabetes in his father; Heart attack in his maternal grandfather; Hyperlipidemia in his mother; Thyroid cancer in his mother.  ROS:   Review of Systems  Constitution: Negative.  HENT: Negative.   Eyes: Negative.   Cardiovascular: Positive for chest pain.  Respiratory: Negative.   Endocrine: Negative.   Hematologic/Lymphatic: Negative.   Skin: Negative.   Musculoskeletal: Negative.   Gastrointestinal: Negative.   Genitourinary: Negative.   Neurological: Negative.   Psychiatric/Behavioral: Negative.   Allergic/Immunologic: Negative.    Please see the history of present illness.     All other systems reviewed and are negative.  EKGs/Labs/Other Studies Reviewed:    The following studies were reviewed today:   EKG:  EKG is  ordered today.  The ekg ordered today demonstrates sinus rhythm right bundle branch block left axis deviation atrial bigeminy  Recent Labs: No results found for requested labs within last 8760 hours.  Recent Lipid Panel No results found for: CHOL, TRIG, HDL, CHOLHDL, VLDL, LDLCALC, LDLDIRECT  Physical Exam:    VS:  BP 120/74 (BP Location: Left Arm, Patient Position: Sitting, Cuff Size: Normal)   Pulse 62   Ht 6' (1.829 m)   Wt 200 lb 9.6 oz (91 kg)   SpO2 96%   BMI 27.21 kg/m     Wt Readings  from Last 3 Encounters:  02/27/18 200 lb 9.6 oz (91 kg)     GEN:  Well nourished, well developed in no acute distress HEENT: Normal NECK: No JVD; No carotid bruits LYMPHATICS: No lymphadenopathy CARDIAC: RRR, no murmurs, rubs, gallops RESPIRATORY:  Clear to auscultation without rales, wheezing or rhonchi  ABDOMEN: Soft, non-tender, non-distended MUSCULOSKELETAL:  No edema; No deformity  SKIN: Warm and dry NEUROLOGIC:  Alert and oriented x 3 PSYCHIATRIC:  Normal affect     Signed, Norman Herrlich, MD  02/27/2018 4:19 PM    [facility]

## 2018-02-28 LAB — BASIC METABOLIC PANEL
BUN / CREAT RATIO: 17 (ref 10–24)
BUN: 22 mg/dL (ref 8–27)
CO2: 23 mmol/L (ref 20–29)
Calcium: 9.8 mg/dL (ref 8.6–10.2)
Chloride: 106 mmol/L (ref 96–106)
Creatinine, Ser: 1.27 mg/dL (ref 0.76–1.27)
GFR calc non Af Amer: 55 mL/min/{1.73_m2} — ABNORMAL LOW (ref >59)
GFR, EST AFRICAN AMERICAN: 63 mL/min/{1.73_m2} (ref >59)
Glucose: 103 mg/dL — ABNORMAL HIGH (ref 65–99)
Potassium: 5 mmol/L (ref 3.5–5.2)
Sodium: 144 mmol/L (ref 134–144)

## 2018-02-28 LAB — TROPONIN I: Troponin I: <0.01 ng/mL (ref 0.00–0.04)

## 2018-03-17 DIAGNOSIS — D510 Vitamin B12 deficiency anemia due to intrinsic factor deficiency: Secondary | ICD-10-CM | POA: Diagnosis not present

## 2018-03-18 ENCOUNTER — Telehealth: Payer: Self-pay | Admitting: Cardiology

## 2018-03-18 NOTE — Telephone Encounter (Signed)
Spoke with patient and reports that he is having more chest discomfort and pain during physical activity.  He describes this pain as worsening since his last office visit with Dr Dulce Sellar.  He does not have any complaints of chest pain during the night.  He has been up moving around this morning, and at the time of the call he was having "some chest pain."  He has not taken any nitroglycerin, because he states that he has used it in the past and it does not help.    Patient was advised to report to ED with having active chest pain, but he states he has been to ED before with these symptoms and "they did not do anything for him."  Will speak with Dr Dulce Sellar for further recommendations.

## 2018-03-18 NOTE — Telephone Encounter (Signed)
Dr Dulce Sellar reviewed patients chart, and advised that patient to come in for an office visit.  Patient was scheduled to see Dr Dulce Sellar tomorrow on 03-19-2018.  Patient is aware that appointment is in [facility].   Patient advised that if symptoms of chest pain worsens throughout the day or night he should proceed to nearest ED.  Patient agreed to plan and verbalized understanding.

## 2018-03-18 NOTE — Telephone Encounter (Signed)
Pt is having an extreme amount of CP and has took nitro and ibuprofen with no relief not want to go to ER

## 2018-03-19 ENCOUNTER — Encounter: Payer: Self-pay | Admitting: Cardiology

## 2018-03-19 ENCOUNTER — Ambulatory Visit (INDEPENDENT_AMBULATORY_CARE_PROVIDER_SITE_OTHER): Payer: Medicare Other | Admitting: Cardiology

## 2018-03-19 VITALS — BP 112/66 | HR 61 | Ht 74.0 in | Wt 202.0 lb

## 2018-03-19 DIAGNOSIS — I2510 Atherosclerotic heart disease of native coronary artery without angina pectoris: Secondary | ICD-10-CM | POA: Diagnosis not present

## 2018-03-19 MED ORDER — DILTIAZEM HCL ER COATED BEADS 240 MG PO CP24: 240.0000 mg | ORAL_CAPSULE | Freq: Every day | ORAL | 1 refills | Status: DC

## 2018-03-19 NOTE — Progress Notes (Signed)
Cardiology Office Note:    Date:  03/19/2018   ID:  Craig Armstrong, DOB 1940/04/21, MRN [id number]  PCP:  Lise Auer, MD  Cardiologist:  Norman Herrlich, MD    Referring MD: Lise Auer, MD    ASSESSMENT:    1. Mild CAD    PLAN:    In order of problems listed above:  1. Continues to be symptomatic New [place] Heart Association class II-III has progressed and now occurs predictably with activities like walking from the parking lot into my building.  Ideally would like to see him undergo cardiac CTA but if it takes longer than 3 weeks we will switch to coronary angiography add calcium channel blocker for antianginal effect use nitroglycerin if needed and will call and try to expedite his cardiac CTA today.   Next appointment: 4 weeks   Medication Adjustments/Labs and Tests Ordered: Current medicines are reviewed at length with the patient today.  Concerns regarding medicines are outlined above.  No orders of the defined types were placed in this encounter.  No orders of the defined types were placed in this encounter.   Chief Complaint  Patient presents with  . Follow-up    for CAD    History of Present Illness:    Craig Armstrong is a 78 y.o. male with a hx of coronary artery disease mild nonobstructive coronary angiography in 2017 with a progressive pattern of exertional angina last seen 02/27/2018. Compliance with diet, lifestyle and medications: Yes  He has yet to be scheduled for cardiac CTA and symptoms have progressed now occurring daily if he goes walks around the block at times walking outside to the car substernal pressure tightness relieved with rest but not relieved with nitroglycerin Past Medical History:  Diagnosis Date  . Chest pain 08/25/2015  . Essential hypertension 02/27/2018  . Hyperlipidemia 08/31/2015  . Mild CAD 02/27/2018  . Type 2 diabetes mellitus (HCC) 08/31/2015  . Unstable angina (HCC) 08/31/2015    Past Surgical History:  Procedure  Laterality Date  . APPENDECTOMY    . CARDIAC CATHETERIZATION    . HERNIA REPAIR    . TONSILLECTOMY      Current Medications: Current Meds  Medication Sig  . aspirin EC 81 MG tablet Take 81 mg by mouth daily.  . Cholecalciferol (VITAMIN D3 PO) Take 2,000 Units by mouth daily.  . Coenzyme Q10 (EQL COQ10) 300 MG CAPS Take 1 capsule by mouth daily.  . cyanocobalamin (,VITAMIN B-12,) 1000 MCG/ML injection Inject 1,000 mcg into the muscle every 30 (thirty) days.  Marland Kitchen levothyroxine (SYNTHROID, LEVOTHROID) 112 MCG tablet TAKE 1 TABLET BY MOUTH ONCE DAILY EXCEPT TAKE 1 2 TABLET ON SUNDAYS  . metFORMIN (GLUCOPHAGE-XR) 500 MG 24 hr tablet Take 1,000 mg by mouth 2 (two) times daily.  . nitroGLYCERIN (NITROSTAT) 0.4 MG SL tablet Place 0.4 mg under the tongue every 5 (five) minutes as needed for chest pain.  . NON FORMULARY VITAL REDS-1 scoop daily  . NON FORMULARY PROSTAGUARD- 1 tablets daily  . pioglitazone (ACTOS) 45 MG tablet Take 45 mg by mouth daily.  . rosuvastatin (CRESTOR) 5 MG tablet Take 5 mg by mouth as directed. Only takes 3 times per week  . tamsulosin (FLOMAX) 0.4 MG CAPS capsule Take 0.4 mg by mouth daily.     Allergies:   Atorvastatin; Zocor [simvastatin]; and Sulfa antibiotics   Social History   Socioeconomic History  . Marital status: Married    Spouse name: Not on file  .  Number of children: Not on file  . Years of education: Not on file  . Highest education level: Not on file  Occupational History  . Not on file  Social Needs  . Financial resource strain: Not on file  . Food insecurity:    Worry: Not on file    Inability: Not on file  . Transportation needs:    Medical: Not on file    Non-medical: Not on file  Tobacco Use  . Smoking status: Never Smoker  . Smokeless tobacco: Never Used  Substance and Sexual Activity  . Alcohol use: Not Currently  . Drug use: Not Currently  . Sexual activity: Not on file  Lifestyle  . Physical activity:    Days per week: Not  on file    Minutes per session: Not on file  . Stress: Not on file  Relationships  . Social connections:    Talks on phone: Not on file    Gets together: Not on file    Attends religious service: Not on file    Active member of club or organization: Not on file    Attends meetings of clubs or organizations: Not on file    Relationship status: Not on file  Other Topics Concern  . Not on file  Social History Narrative  . Not on file     Family History: The patient's family history includes Diabetes in his father; Heart attack in his maternal grandfather; Hyperlipidemia in his mother; Thyroid cancer in his mother. ROS:   Please see the history of present illness.    All other systems reviewed and are negative.  EKGs/Labs/Other Studies Reviewed:    The following studies were reviewed today:  EKG:  EKG ordered today.  The ekg ordered today demonstrates sinus rhythm right bundle  Recent Labs: 02/27/2018: BUN 22; Creatinine, Ser 1.27; Potassium 5.0; Sodium 144  Recent Lipid Panel No results found for: CHOL, TRIG, HDL, CHOLHDL, VLDL, LDLCALC, LDLDIRECT  Physical Exam:    VS:  BP 112/66 (BP Location: Right Arm, Patient Position: Sitting, Cuff Size: Normal)   Pulse 61   Ht 6\' 2"  (1.88 m)   Wt 202 lb (91.6 kg)   SpO2 96%   BMI 25.94 kg/m     Wt Readings from Last 3 Encounters:  03/19/18 202 lb (91.6 kg)  02/27/18 200 lb 9.6 oz (91 kg)     GEN:  Well nourished, well developed in no acute distress HEENT: Normal NECK: No JVD; No carotid bruits LYMPHATICS: No lymphadenopathy CARDIAC: RRR, no murmurs, rubs, gallops RESPIRATORY:  Clear to auscultation without rales, wheezing or rhonchi  ABDOMEN: Soft, non-tender, non-distended MUSCULOSKELETAL:  No edema; No deformity  SKIN: Warm and dry NEUROLOGIC:  Alert and oriented x 3 PSYCHIATRIC:  Normal affect    Signed, Norman Herrlich, MD  03/19/2018 2:27 PM    [facility]

## 2018-03-19 NOTE — Patient Instructions (Addendum)
Medication Instructions:  Your physician has recommended you make the following change in your medication: START taking cardizem 240 mg (1 capsule) per day  If you need a refill on your cardiac medications before your next appointment, please call your pharmacy.   Lab work: NONE  Testing/Procedures: You should be getting a return call to schedule cardiac CTA.    Follow-Up: At [facility], you and your health needs are our priority.  As part of our continuing mission to provide you with exceptional heart care, we have created designated Provider Care Teams.  These Care Teams include your primary Cardiologist (physician) and Advanced Practice Providers (APPs -  Physician Assistants and Nurse Practitioners) who all work together to provide you with the care you need, when you need it. You will need a follow up appointment in 1 months.     Diltiazem extended-release capsules or tablets What is this medicine? DILTIAZEM (dil TYE a zem) is a calcium-channel blocker. It affects the amount of calcium found in your heart and muscle cells. This relaxes your blood vessels, which can reduce the amount of work the heart has to do. This medicine is used to treat high blood pressure and chest pain caused by angina. This medicine may be used for other purposes; ask your health care provider or pharmacist if you have questions. COMMON BRAND NAME(S): Cardizem CD, Cardizem LA, Cardizem SR, Cartia XT, Dilacor XR, Dilt-CD, Diltia XT, Diltzac, Matzim LA, Verna Czechaztia XT, Tiamate, Tiazac What should I tell my health care provider before I take this medicine? They need to know if you have any of these conditions: -heart problems, low blood pressure, irregular heartbeat -liver disease -previous heart attack -an unusual or allergic reaction to diltiazem, other medicines, foods, dyes, or preservatives -pregnant or trying to get pregnant -breast-feeding How should I use this medicine? Take this medicine by mouth  with a glass of water. Follow the directions on the prescription label. Swallow whole, do not crush or chew. Ask your doctor or pharmacist if your should take this medicine with food. Take your doses at regular intervals. Do not take your medicine more often then directed. Do not stop taking except on the advice of your doctor or health care professional. Ask your doctor or health care professional how to gradually reduce the dose. Talk to your pediatrician regarding the use of this medicine in children. Special care may be needed. Overdosage: If you think you have taken too much of this medicine contact a poison control center or emergency room at once. NOTE: This medicine is only for you. Do not share this medicine with others. What if I miss a dose? If you miss a dose, take it as soon as you can. If it is almost time for your next dose, take only that dose. Do not take double or extra doses. What may interact with this medicine? Do not take this medicine with any of the following medications: -cisapride -hawthorn -pimozide -ranolazine -red yeast rice This medicine may also interact with the following medications: -buspirone -carbamazepine -cimetidine -cyclosporine -digoxin -local anesthetics or general anesthetics -lovastatin -medicines for anxiety or difficulty sleeping like midazolam and triazolam -medicines for high blood pressure or heart problems -quinidine -rifampin, rifabutin, or rifapentine This list may not describe all possible interactions. Give your health care provider a list of all the medicines, herbs, non-prescription drugs, or dietary supplements you use. Also tell them if you smoke, drink alcohol, or use illegal drugs. Some items may interact with your medicine. What  should I watch for while using this medicine? Check your blood pressure and pulse rate regularly. Ask your doctor or health care professional what your blood pressure and pulse rate should be and when you  should contact him or her. You may feel dizzy or lightheaded. Do not drive, use machinery, or do anything that needs mental alertness until you know how this medicine affects you. To reduce the risk of dizzy or fainting spells, do not sit or stand up quickly, especially if you are an older patient. Alcohol can make you more dizzy or increase flushing and rapid heartbeats. Avoid alcoholic drinks. What side effects may I notice from receiving this medicine? Side effects that you should report to your doctor or health care professional as soon as possible: -allergic reactions like skin rash, itching or hives, swelling of the face, lips, or tongue -confusion, mental depression -feeling faint or lightheaded, falls -redness, blistering, peeling or loosening of the skin, including inside the mouth -slow, irregular heartbeat -swelling of the feet and ankles -unusual bleeding or bruising, pinpoint red spots on the skin Side effects that usually do not require medical attention (report to your doctor or health care professional if they continue or are bothersome): -constipation or diarrhea -difficulty sleeping -facial flushing -headache -nausea, vomiting -sexual dysfunction -weak or tired This list may not describe all possible side effects. Call your doctor for medical advice about side effects. You may report side effects to FDA at 1-800-FDA-1088. Where should I keep my medicine? Keep out of the reach of children. Store at room temperature between 15 and 30 degrees C (59 and 86 degrees F). Protect from humidity. Throw away any unused medicine after the expiration date. NOTE: This sheet is a summary. It may not cover all possible information. If you have questions about this medicine, talk to your doctor, pharmacist, or health care provider.  2019 Elsevier/Gold Standard (2007-05-15 14:35:47)

## 2018-03-24 ENCOUNTER — Telehealth (HOSPITAL_COMMUNITY): Payer: Self-pay | Admitting: Emergency Medicine

## 2018-03-24 NOTE — Telephone Encounter (Signed)
Reaching out to patient to offer assistance regarding upcoming cardiac imaging study; pt verbalizes understanding of appt date/time, parking situation and where to check in, pre-test NPO status and medications ordered, and verified current allergies; name and call back number provided for further questions should they arise Arzella Rehmann RN Navigator Cardiac Imaging [facility] [phone] office [phone] cell 

## 2018-03-25 ENCOUNTER — Ambulatory Visit (HOSPITAL_COMMUNITY): Payer: Medicare Other

## 2018-03-26 ENCOUNTER — Ambulatory Visit (HOSPITAL_COMMUNITY)
Admission: RE | Admit: 2018-03-26 | Discharge: 2018-03-26 | Disposition: A | Discharge status: Home / Self Care | Payer: Medicare Other | Source: Ambulatory Visit | Attending: Cardiology | Admitting: Cardiology

## 2018-03-26 DIAGNOSIS — I2510 Atherosclerotic heart disease of native coronary artery without angina pectoris: Secondary | ICD-10-CM | POA: Diagnosis not present

## 2018-03-26 DIAGNOSIS — I25110 Atherosclerotic heart disease of native coronary artery with unstable angina pectoris: Secondary | ICD-10-CM | POA: Diagnosis not present

## 2018-03-26 MED ORDER — IOPAMIDOL (ISOVUE-370) INJECTION 76%
80.0000 mL | Freq: Once | INTRAVENOUS | Status: AC | PRN
  Administered 2018-03-26: 80 mL via INTRAVENOUS

## 2018-03-26 MED ORDER — NITROGLYCERIN 0.4 MG SL SUBL
0.8000 mg | SUBLINGUAL_TABLET | Freq: Once | SUBLINGUAL | Status: AC
  Administered 2018-03-26: 0.8 mg via SUBLINGUAL
  Filled 2018-03-26: qty 25

## 2018-03-27 ENCOUNTER — Encounter: Payer: Medicare Other | Admitting: Cardiology

## 2018-03-27 ENCOUNTER — Encounter: Payer: Self-pay | Admitting: Cardiology

## 2018-03-27 ENCOUNTER — Ambulatory Visit: Payer: Medicare Other | Admitting: Cardiology

## 2018-03-27 NOTE — Progress Notes (Signed)
This encounter was created in error - please disregard.

## 2018-03-28 ENCOUNTER — Other Ambulatory Visit: Payer: Self-pay | Admitting: Cardiology

## 2018-03-28 DIAGNOSIS — I2510 Atherosclerotic heart disease of native coronary artery without angina pectoris: Secondary | ICD-10-CM

## 2018-03-28 MED ORDER — RANOLAZINE ER 500 MG PO TB12: 500.0000 mg | ORAL_TABLET | Freq: Two times a day (BID) | ORAL | 2 refills | Status: DC

## 2018-03-28 MED ORDER — NITROGLYCERIN 0.4 MG SL SUBL: 0.4000 mg | SUBLINGUAL_TABLET | SUBLINGUAL | 2 refills | Status: DC | PRN

## 2018-04-09 NOTE — Progress Notes (Signed)
Cardiology Office Note:    Date:  04/10/2018   ID:  Craig Armstrong, DOB [birth year], MRN [id number]  PCP:  Lise Auer, MD  Cardiologist:  Norman Herrlich, MD    Referring MD: Lise Auer, MD    ASSESSMENT:    1. CAD in native artery   2. Mixed hyperlipidemia   3. Type 2 diabetes mellitus without complication, without long-term current use of insulin (HCC)    PLAN:    In order of problems listed above:  1. Stable cardiac CTA fractional flow reserve favors medical therapy he has been intolerant of ranolazine continue aspirin and statin and rate limiting calcium channel blocker.  If needed we can add oral mononitrates if he fails medications PCI would be appropriate to alleviate symptoms. 2. Stable continue a statin 3. Stable managed by his PCP   Next appointment: 6 months   Medication Adjustments/Labs and Tests Ordered: Current medicines are reviewed at length with the patient today.  Concerns regarding medicines are outlined above.  No orders of the defined types were placed in this encounter.  No orders of the defined types were placed in this encounter.   Chief Complaint  Patient presents with  . Coronary Artery Disease     after CCTA and FFR    History of Present Illness:    Craig Armstrong is a 78 y.o. male with a hx of coronary artery disease mild nonobstructive coronary angiography in 2017 with a progressive pattern of exertional angina referred for CCTA last seen 03/19/18. Compliance with diet, lifestyle and medications: Yes Overall is improved at times he gets very vague mild momentary chest tightness with exertion but has not really experienced angina since starting calcium channel blocker.  Unfortunately started ranolazine he had marked weakness discontinued.  Overall he is pleased with the quality of his life he has been hiking without difficulty and we had an opportunity sit down and review his cardiac CTA he has single-vessel coronary artery disease and by  fractional flow reserve it is not severe at this time would not recommend revascularization.  I have asked him be vigorous active and finds he is having angina we can place him on additional medications or mononitrate or consider PCI to alleviate symptoms.  He is comfortable with this approach will continue his current medications including lipid-lowering and for diabetes I will plan to see back in the office in 6 months or sooner if he is having frequent or limiting angina.  03/26/18 CCTA: IMPRESSION: 1. The patient's coronary artery calcium score is 385, which places the patient in the 57th percentile. 2. Normal coronary origin with right dominance. 3. Possible severe CAD in the mid LAD, CADRADS = 4. CT FFR analysis will be performed and reported separately.  FFR from CCTA:   FINDINGS: FFRct analysis was performed on the original cardiac CT angiogram dataset. Diagrammatic representation of the FFRct analysis is provided in a separate PDF document in PACS. This dictation was created using the PDF document and an interactive 3D model of the results. 3D model is not available in the EMR/PACS. Normal FFR range is >0.80. 1. Left Main:  No significant stenosis. FFR = 0.99 2. LAD: No significant stenosis. Proximal FFR = 0.87, Mid FFR = 0.87, Distal FFR = 0.81, D1 = 0.88 3. LCX: No significant stenosis. Proximal FFR = 0.99, Distal FFR = 0.82, OM = 0.90 4. RCA: No significant stenosis. Proximal FFR = 0.98, Mid FFR = 0.96, Distal FFR = 0.91 Past Medical History:  Diagnosis Date  . Chest pain 08/25/2015  . Essential hypertension 02/27/2018  . Hyperlipidemia 08/31/2015  . Mild CAD 02/27/2018  . Type 2 diabetes mellitus (HCC) 08/31/2015  . Unstable angina (HCC) 08/31/2015    Past Surgical History:  Procedure Laterality Date  . APPENDECTOMY    . CARDIAC CATHETERIZATION    . HERNIA REPAIR    . TONSILLECTOMY      Current Medications: Current Meds  Medication Sig  . aspirin EC 81 MG tablet  Take 81 mg by mouth daily.  . Cholecalciferol (VITAMIN D3 PO) Take 2,000 Units by mouth daily.  . Coenzyme Q10 (EQL COQ10) 300 MG CAPS Take 1 capsule by mouth daily.  . cyanocobalamin (,VITAMIN B-12,) 1000 MCG/ML injection Inject 1,000 mcg into the muscle every 30 (thirty) days.  Marland Kitchen diltiazem (CARDIZEM CD) 240 MG 24 hr capsule Take 1 capsule (240 mg total) by mouth daily.  Marland Kitchen levothyroxine (SYNTHROID, LEVOTHROID) 112 MCG tablet Take 112 mcg by mouth daily before breakfast.   . metFORMIN (GLUCOPHAGE-XR) 500 MG 24 hr tablet Take 1,000 mg by mouth 2 (two) times daily.  . nitroGLYCERIN (NITROSTAT) 0.4 MG SL tablet Place 1 tablet (0.4 mg total) under the tongue every 5 (five) minutes as needed for chest pain (use SL prn angina, repeat in 5 minutes if needed).  . NON FORMULARY VITAL REDS-1 scoop daily  . NON FORMULARY PROSTAGUARD- 1 tablets daily  . pioglitazone (ACTOS) 45 MG tablet Take 45 mg by mouth daily.  . rosuvastatin (CRESTOR) 5 MG tablet Take 5 mg by mouth as directed. Only takes 3 times per week  . tamsulosin (FLOMAX) 0.4 MG CAPS capsule Take 0.4 mg by mouth daily.     Allergies:   Atorvastatin; Ranexa [ranolazine]; Zocor [simvastatin]; and Sulfa antibiotics   Social History   Socioeconomic History  . Marital status: Married    Spouse name: Not on file  . Number of children: Not on file  . Years of education: Not on file  . Highest education level: Not on file  Occupational History  . Not on file  Social Needs  . Financial resource strain: Not on file  . Food insecurity:    Worry: Not on file    Inability: Not on file  . Transportation needs:    Medical: Not on file    Non-medical: Not on file  Tobacco Use  . Smoking status: Never Smoker  . Smokeless tobacco: Never Used  Substance and Sexual Activity  . Alcohol use: Not Currently  . Drug use: Not Currently  . Sexual activity: Not on file  Lifestyle  . Physical activity:    Days per week: Not on file    Minutes per  session: Not on file  . Stress: Not on file  Relationships  . Social connections:    Talks on phone: Not on file    Gets together: Not on file    Attends religious service: Not on file    Active member of club or organization: Not on file    Attends meetings of clubs or organizations: Not on file    Relationship status: Not on file  Other Topics Concern  . Not on file  Social History Narrative  . Not on file     Family History: The patient's family history includes Diabetes in his father; Heart attack in his maternal grandfather; Hyperlipidemia in his mother; Thyroid cancer in his mother. ROS:   Please see the history of present illness.    All  other systems reviewed and are negative.  EKGs/Labs/Other Studies Reviewed:    The following studies were reviewed today:   Recent Labs: 02/27/2018: BUN 22; Creatinine, Ser 1.27; Potassium 5.0; Sodium 144  Recent Lipid Panel No results found for: CHOL, TRIG, HDL, CHOLHDL, VLDL, LDLCALC, LDLDIRECT  Physical Exam:    VS:  BP 112/66 (BP Location: Right Arm, Patient Position: Sitting, Cuff Size: Normal)   Pulse (!) 57   Ht 6' (1.829 m)   Wt 201 lb (91.2 kg)   SpO2 96%   BMI 27.26 kg/m     Wt Readings from Last 3 Encounters:  04/10/18 201 lb (91.2 kg)  03/27/18 203 lb 3.2 oz (92.2 kg)  03/19/18 202 lb (91.6 kg)     GEN:  Well nourished, well developed in no acute distress HEENT: Normal NECK: No JVD; No carotid bruits LYMPHATICS: No lymphadenopathy CARDIAC: RRR, no murmurs, rubs, gallops RESPIRATORY:  Clear to auscultation without rales, wheezing or rhonchi  ABDOMEN: Soft, non-tender, non-distended MUSCULOSKELETAL:  No edema; No deformity  SKIN: Warm and dry NEUROLOGIC:  Alert and oriented x 3 PSYCHIATRIC:  Normal affect    Signed, Norman Herrlich, MD  04/10/2018 12:54 PM    [facility]

## 2018-04-10 ENCOUNTER — Encounter: Payer: Self-pay | Admitting: Cardiology

## 2018-04-10 ENCOUNTER — Ambulatory Visit (INDEPENDENT_AMBULATORY_CARE_PROVIDER_SITE_OTHER): Payer: Medicare Other | Admitting: Cardiology

## 2018-04-10 VITALS — BP 112/66 | HR 57 | Ht 72.0 in | Wt 201.0 lb

## 2018-04-10 DIAGNOSIS — E119 Type 2 diabetes mellitus without complications: Secondary | ICD-10-CM

## 2018-04-10 DIAGNOSIS — E782 Mixed hyperlipidemia: Secondary | ICD-10-CM | POA: Diagnosis not present

## 2018-04-10 DIAGNOSIS — I2510 Atherosclerotic heart disease of native coronary artery without angina pectoris: Secondary | ICD-10-CM

## 2018-04-10 NOTE — Patient Instructions (Addendum)
Medication Instructions:  Your physician recommends that you continue on your current medications as directed. Please refer to the Current Medication list given to you today.  If you need a refill on your cardiac medications before your next appointment, please call your pharmacy.   Lab work: None.  If you have labs (blood work) drawn today and your tests are completely normal, you will receive your results only by: . MyChart Message (if you have MyChart) OR . A paper copy in the mail If you have any lab test that is abnormal or we need to change your treatment, we will call you to review the results.  Testing/Procedures: None.   Follow-Up: At [facility], you and your health needs are our priority.  As part of our continuing mission to provide you with exceptional heart care, we have created designated Provider Care Teams.  These Care Teams include your primary Cardiologist (physician) and Advanced Practice Providers (APPs -  Physician Assistants and Nurse Practitioners) who all work together to provide you with the care you need, when you need it. You will need a follow up appointment in 6 months.     

## 2018-04-21 DIAGNOSIS — E782 Mixed hyperlipidemia: Secondary | ICD-10-CM | POA: Diagnosis not present

## 2018-04-21 DIAGNOSIS — E119 Type 2 diabetes mellitus without complications: Secondary | ICD-10-CM | POA: Diagnosis not present

## 2018-04-21 DIAGNOSIS — Z79899 Other long term (current) drug therapy: Secondary | ICD-10-CM | POA: Diagnosis not present

## 2018-04-21 DIAGNOSIS — E039 Hypothyroidism, unspecified: Secondary | ICD-10-CM | POA: Diagnosis not present

## 2018-04-25 DIAGNOSIS — E118 Type 2 diabetes mellitus with unspecified complications: Secondary | ICD-10-CM | POA: Diagnosis not present

## 2018-04-25 DIAGNOSIS — I201 Angina pectoris with documented spasm: Secondary | ICD-10-CM | POA: Diagnosis not present

## 2018-04-25 DIAGNOSIS — E039 Hypothyroidism, unspecified: Secondary | ICD-10-CM | POA: Diagnosis not present

## 2018-04-25 DIAGNOSIS — E1165 Type 2 diabetes mellitus with hyperglycemia: Secondary | ICD-10-CM | POA: Diagnosis not present

## 2018-05-06 DIAGNOSIS — E039 Hypothyroidism, unspecified: Secondary | ICD-10-CM | POA: Diagnosis not present

## 2018-05-06 DIAGNOSIS — D519 Vitamin B12 deficiency anemia, unspecified: Secondary | ICD-10-CM | POA: Diagnosis not present

## 2018-05-06 DIAGNOSIS — E119 Type 2 diabetes mellitus without complications: Secondary | ICD-10-CM | POA: Diagnosis not present

## 2018-05-23 DIAGNOSIS — D510 Vitamin B12 deficiency anemia due to intrinsic factor deficiency: Secondary | ICD-10-CM | POA: Diagnosis not present

## 2018-05-29 DIAGNOSIS — E118 Type 2 diabetes mellitus with unspecified complications: Secondary | ICD-10-CM | POA: Diagnosis not present

## 2018-06-05 DIAGNOSIS — D519 Vitamin B12 deficiency anemia, unspecified: Secondary | ICD-10-CM | POA: Diagnosis not present

## 2018-06-05 DIAGNOSIS — E782 Mixed hyperlipidemia: Secondary | ICD-10-CM | POA: Diagnosis not present

## 2018-07-05 DIAGNOSIS — E782 Mixed hyperlipidemia: Secondary | ICD-10-CM | POA: Diagnosis not present

## 2018-07-05 DIAGNOSIS — E039 Hypothyroidism, unspecified: Secondary | ICD-10-CM | POA: Diagnosis not present

## 2018-08-05 DIAGNOSIS — E119 Type 2 diabetes mellitus without complications: Secondary | ICD-10-CM | POA: Diagnosis not present

## 2018-08-05 DIAGNOSIS — E039 Hypothyroidism, unspecified: Secondary | ICD-10-CM | POA: Diagnosis not present

## 2018-08-13 DIAGNOSIS — E118 Type 2 diabetes mellitus with unspecified complications: Secondary | ICD-10-CM | POA: Diagnosis not present

## 2018-08-13 DIAGNOSIS — E538 Deficiency of other specified B group vitamins: Secondary | ICD-10-CM | POA: Diagnosis not present

## 2018-08-20 DIAGNOSIS — E039 Hypothyroidism, unspecified: Secondary | ICD-10-CM | POA: Diagnosis not present

## 2018-08-20 DIAGNOSIS — E119 Type 2 diabetes mellitus without complications: Secondary | ICD-10-CM | POA: Diagnosis not present

## 2018-08-20 DIAGNOSIS — E782 Mixed hyperlipidemia: Secondary | ICD-10-CM | POA: Diagnosis not present

## 2018-08-20 DIAGNOSIS — Z6825 Body mass index (BMI) 25.0-25.9, adult: Secondary | ICD-10-CM | POA: Diagnosis not present

## 2018-10-14 DIAGNOSIS — H61892 Other specified disorders of left external ear: Secondary | ICD-10-CM | POA: Diagnosis not present

## 2018-10-14 DIAGNOSIS — Z6826 Body mass index (BMI) 26.0-26.9, adult: Secondary | ICD-10-CM | POA: Diagnosis not present

## 2018-10-16 DIAGNOSIS — D519 Vitamin B12 deficiency anemia, unspecified: Secondary | ICD-10-CM | POA: Diagnosis not present

## 2018-11-05 DIAGNOSIS — E118 Type 2 diabetes mellitus with unspecified complications: Secondary | ICD-10-CM | POA: Diagnosis not present

## 2018-11-05 DIAGNOSIS — E782 Mixed hyperlipidemia: Secondary | ICD-10-CM | POA: Diagnosis not present

## 2018-11-05 DIAGNOSIS — D519 Vitamin B12 deficiency anemia, unspecified: Secondary | ICD-10-CM | POA: Diagnosis not present

## 2018-11-05 DIAGNOSIS — E039 Hypothyroidism, unspecified: Secondary | ICD-10-CM | POA: Diagnosis not present

## 2018-11-10 ENCOUNTER — Ambulatory Visit: Payer: Medicare Other | Admitting: Cardiology

## 2018-11-20 DIAGNOSIS — D519 Vitamin B12 deficiency anemia, unspecified: Secondary | ICD-10-CM | POA: Diagnosis not present

## 2018-11-24 DIAGNOSIS — E118 Type 2 diabetes mellitus with unspecified complications: Secondary | ICD-10-CM | POA: Diagnosis not present

## 2018-11-24 DIAGNOSIS — E782 Mixed hyperlipidemia: Secondary | ICD-10-CM | POA: Diagnosis not present

## 2018-11-24 DIAGNOSIS — D519 Vitamin B12 deficiency anemia, unspecified: Secondary | ICD-10-CM | POA: Diagnosis not present

## 2018-11-24 DIAGNOSIS — E1165 Type 2 diabetes mellitus with hyperglycemia: Secondary | ICD-10-CM | POA: Diagnosis not present

## 2018-12-05 DIAGNOSIS — E782 Mixed hyperlipidemia: Secondary | ICD-10-CM | POA: Diagnosis not present

## 2018-12-05 DIAGNOSIS — E118 Type 2 diabetes mellitus with unspecified complications: Secondary | ICD-10-CM | POA: Diagnosis not present

## 2019-01-05 DIAGNOSIS — D519 Vitamin B12 deficiency anemia, unspecified: Secondary | ICD-10-CM | POA: Diagnosis not present

## 2019-01-05 DIAGNOSIS — E782 Mixed hyperlipidemia: Secondary | ICD-10-CM | POA: Diagnosis not present

## 2019-01-05 DIAGNOSIS — E119 Type 2 diabetes mellitus without complications: Secondary | ICD-10-CM | POA: Diagnosis not present

## 2019-01-14 DIAGNOSIS — Z6826 Body mass index (BMI) 26.0-26.9, adult: Secondary | ICD-10-CM | POA: Diagnosis not present

## 2019-01-14 DIAGNOSIS — E119 Type 2 diabetes mellitus without complications: Secondary | ICD-10-CM | POA: Diagnosis not present

## 2019-01-14 DIAGNOSIS — R030 Elevated blood-pressure reading, without diagnosis of hypertension: Secondary | ICD-10-CM | POA: Diagnosis not present

## 2019-01-14 DIAGNOSIS — D510 Vitamin B12 deficiency anemia due to intrinsic factor deficiency: Secondary | ICD-10-CM | POA: Diagnosis not present

## 2019-02-05 DIAGNOSIS — E039 Hypothyroidism, unspecified: Secondary | ICD-10-CM | POA: Diagnosis not present

## 2019-02-05 DIAGNOSIS — D519 Vitamin B12 deficiency anemia, unspecified: Secondary | ICD-10-CM | POA: Diagnosis not present

## 2019-02-17 DIAGNOSIS — D519 Vitamin B12 deficiency anemia, unspecified: Secondary | ICD-10-CM | POA: Diagnosis not present

## 2019-02-19 DIAGNOSIS — E119 Type 2 diabetes mellitus without complications: Secondary | ICD-10-CM | POA: Diagnosis not present

## 2019-02-27 DIAGNOSIS — E118 Type 2 diabetes mellitus with unspecified complications: Secondary | ICD-10-CM | POA: Diagnosis not present

## 2019-02-27 DIAGNOSIS — Z6826 Body mass index (BMI) 26.0-26.9, adult: Secondary | ICD-10-CM | POA: Diagnosis not present

## 2019-02-27 DIAGNOSIS — E782 Mixed hyperlipidemia: Secondary | ICD-10-CM | POA: Diagnosis not present

## 2019-02-27 DIAGNOSIS — E1165 Type 2 diabetes mellitus with hyperglycemia: Secondary | ICD-10-CM | POA: Diagnosis not present

## 2019-03-04 DIAGNOSIS — E1165 Type 2 diabetes mellitus with hyperglycemia: Secondary | ICD-10-CM | POA: Diagnosis not present

## 2019-03-04 DIAGNOSIS — E118 Type 2 diabetes mellitus with unspecified complications: Secondary | ICD-10-CM | POA: Diagnosis not present

## 2019-03-07 DIAGNOSIS — N529 Male erectile dysfunction, unspecified: Secondary | ICD-10-CM | POA: Diagnosis not present

## 2019-03-07 DIAGNOSIS — D519 Vitamin B12 deficiency anemia, unspecified: Secondary | ICD-10-CM | POA: Diagnosis not present

## 2019-03-07 DIAGNOSIS — E039 Hypothyroidism, unspecified: Secondary | ICD-10-CM | POA: Diagnosis not present

## 2019-03-18 DIAGNOSIS — E118 Type 2 diabetes mellitus with unspecified complications: Secondary | ICD-10-CM | POA: Diagnosis not present

## 2019-03-18 DIAGNOSIS — E1165 Type 2 diabetes mellitus with hyperglycemia: Secondary | ICD-10-CM | POA: Diagnosis not present

## 2019-04-03 DIAGNOSIS — E782 Mixed hyperlipidemia: Secondary | ICD-10-CM | POA: Diagnosis not present

## 2019-04-03 DIAGNOSIS — E1165 Type 2 diabetes mellitus with hyperglycemia: Secondary | ICD-10-CM | POA: Diagnosis not present

## 2019-04-03 DIAGNOSIS — N644 Mastodynia: Secondary | ICD-10-CM | POA: Diagnosis not present

## 2019-04-03 DIAGNOSIS — E118 Type 2 diabetes mellitus with unspecified complications: Secondary | ICD-10-CM | POA: Diagnosis not present

## 2019-04-20 DIAGNOSIS — N644 Mastodynia: Secondary | ICD-10-CM | POA: Diagnosis not present

## 2019-04-30 DIAGNOSIS — R928 Other abnormal and inconclusive findings on diagnostic imaging of breast: Secondary | ICD-10-CM | POA: Diagnosis not present

## 2019-04-30 DIAGNOSIS — N644 Mastodynia: Secondary | ICD-10-CM | POA: Diagnosis not present

## 2019-04-30 DIAGNOSIS — N62 Hypertrophy of breast: Secondary | ICD-10-CM | POA: Diagnosis not present

## 2019-05-04 DIAGNOSIS — D519 Vitamin B12 deficiency anemia, unspecified: Secondary | ICD-10-CM | POA: Diagnosis not present

## 2019-05-06 DIAGNOSIS — E782 Mixed hyperlipidemia: Secondary | ICD-10-CM | POA: Diagnosis not present

## 2019-05-06 DIAGNOSIS — E039 Hypothyroidism, unspecified: Secondary | ICD-10-CM | POA: Diagnosis not present

## 2019-05-21 DIAGNOSIS — E782 Mixed hyperlipidemia: Secondary | ICD-10-CM | POA: Diagnosis not present

## 2019-05-21 DIAGNOSIS — Z79899 Other long term (current) drug therapy: Secondary | ICD-10-CM | POA: Diagnosis not present

## 2019-05-21 DIAGNOSIS — E119 Type 2 diabetes mellitus without complications: Secondary | ICD-10-CM | POA: Diagnosis not present

## 2019-05-28 DIAGNOSIS — E1169 Type 2 diabetes mellitus with other specified complication: Secondary | ICD-10-CM | POA: Diagnosis not present

## 2019-05-28 DIAGNOSIS — I201 Angina pectoris with documented spasm: Secondary | ICD-10-CM | POA: Diagnosis not present

## 2019-05-28 DIAGNOSIS — E782 Mixed hyperlipidemia: Secondary | ICD-10-CM | POA: Diagnosis not present

## 2019-05-28 DIAGNOSIS — E785 Hyperlipidemia, unspecified: Secondary | ICD-10-CM | POA: Diagnosis not present

## 2019-06-05 DIAGNOSIS — E1169 Type 2 diabetes mellitus with other specified complication: Secondary | ICD-10-CM | POA: Diagnosis not present

## 2019-06-05 DIAGNOSIS — E782 Mixed hyperlipidemia: Secondary | ICD-10-CM | POA: Diagnosis not present

## 2019-07-06 DIAGNOSIS — E039 Hypothyroidism, unspecified: Secondary | ICD-10-CM | POA: Diagnosis not present

## 2019-07-06 DIAGNOSIS — E1169 Type 2 diabetes mellitus with other specified complication: Secondary | ICD-10-CM | POA: Diagnosis not present

## 2019-07-06 DIAGNOSIS — E782 Mixed hyperlipidemia: Secondary | ICD-10-CM | POA: Diagnosis not present

## 2019-07-07 DIAGNOSIS — D519 Vitamin B12 deficiency anemia, unspecified: Secondary | ICD-10-CM | POA: Diagnosis not present

## 2019-07-13 DIAGNOSIS — H524 Presbyopia: Secondary | ICD-10-CM | POA: Diagnosis not present

## 2019-07-13 DIAGNOSIS — E119 Type 2 diabetes mellitus without complications: Secondary | ICD-10-CM | POA: Diagnosis not present

## 2019-09-24 DIAGNOSIS — E538 Deficiency of other specified B group vitamins: Secondary | ICD-10-CM | POA: Diagnosis not present

## 2019-10-06 DIAGNOSIS — E039 Hypothyroidism, unspecified: Secondary | ICD-10-CM | POA: Diagnosis not present

## 2019-10-06 DIAGNOSIS — E1169 Type 2 diabetes mellitus with other specified complication: Secondary | ICD-10-CM | POA: Diagnosis not present

## 2019-10-06 DIAGNOSIS — E782 Mixed hyperlipidemia: Secondary | ICD-10-CM | POA: Diagnosis not present

## 2019-10-07 DIAGNOSIS — E1165 Type 2 diabetes mellitus with hyperglycemia: Secondary | ICD-10-CM | POA: Diagnosis not present

## 2019-10-07 DIAGNOSIS — Z125 Encounter for screening for malignant neoplasm of prostate: Secondary | ICD-10-CM | POA: Diagnosis not present

## 2019-10-07 DIAGNOSIS — E039 Hypothyroidism, unspecified: Secondary | ICD-10-CM | POA: Diagnosis not present

## 2019-10-07 DIAGNOSIS — Z79899 Other long term (current) drug therapy: Secondary | ICD-10-CM | POA: Diagnosis not present

## 2019-11-05 DIAGNOSIS — E1169 Type 2 diabetes mellitus with other specified complication: Secondary | ICD-10-CM | POA: Diagnosis not present

## 2019-11-05 DIAGNOSIS — E039 Hypothyroidism, unspecified: Secondary | ICD-10-CM | POA: Diagnosis not present

## 2019-11-05 DIAGNOSIS — E782 Mixed hyperlipidemia: Secondary | ICD-10-CM | POA: Diagnosis not present

## 2019-11-09 DIAGNOSIS — D510 Vitamin B12 deficiency anemia due to intrinsic factor deficiency: Secondary | ICD-10-CM | POA: Diagnosis not present

## 2019-12-28 DIAGNOSIS — D510 Vitamin B12 deficiency anemia due to intrinsic factor deficiency: Secondary | ICD-10-CM | POA: Diagnosis not present

## 2020-02-04 DIAGNOSIS — E039 Hypothyroidism, unspecified: Secondary | ICD-10-CM | POA: Diagnosis not present

## 2020-02-04 DIAGNOSIS — N400 Enlarged prostate without lower urinary tract symptoms: Secondary | ICD-10-CM | POA: Diagnosis not present

## 2020-02-04 DIAGNOSIS — D519 Vitamin B12 deficiency anemia, unspecified: Secondary | ICD-10-CM | POA: Diagnosis not present

## 2020-02-04 DIAGNOSIS — E782 Mixed hyperlipidemia: Secondary | ICD-10-CM | POA: Diagnosis not present

## 2020-02-19 DIAGNOSIS — D510 Vitamin B12 deficiency anemia due to intrinsic factor deficiency: Secondary | ICD-10-CM | POA: Diagnosis not present

## 2020-03-06 DIAGNOSIS — E782 Mixed hyperlipidemia: Secondary | ICD-10-CM | POA: Diagnosis not present

## 2020-03-06 DIAGNOSIS — D519 Vitamin B12 deficiency anemia, unspecified: Secondary | ICD-10-CM | POA: Diagnosis not present

## 2020-03-06 DIAGNOSIS — E039 Hypothyroidism, unspecified: Secondary | ICD-10-CM | POA: Diagnosis not present

## 2020-03-17 DIAGNOSIS — M722 Plantar fascial fibromatosis: Secondary | ICD-10-CM | POA: Diagnosis not present

## 2020-03-17 DIAGNOSIS — E1142 Type 2 diabetes mellitus with diabetic polyneuropathy: Secondary | ICD-10-CM | POA: Diagnosis not present

## 2020-03-25 DIAGNOSIS — D510 Vitamin B12 deficiency anemia due to intrinsic factor deficiency: Secondary | ICD-10-CM | POA: Diagnosis not present

## 2020-04-04 DIAGNOSIS — E782 Mixed hyperlipidemia: Secondary | ICD-10-CM | POA: Diagnosis not present

## 2020-04-04 DIAGNOSIS — E039 Hypothyroidism, unspecified: Secondary | ICD-10-CM | POA: Diagnosis not present

## 2020-04-04 DIAGNOSIS — E1169 Type 2 diabetes mellitus with other specified complication: Secondary | ICD-10-CM | POA: Diagnosis not present

## 2020-04-08 DIAGNOSIS — E119 Type 2 diabetes mellitus without complications: Secondary | ICD-10-CM | POA: Diagnosis not present

## 2020-04-08 DIAGNOSIS — E039 Hypothyroidism, unspecified: Secondary | ICD-10-CM | POA: Diagnosis not present

## 2020-04-13 DIAGNOSIS — E782 Mixed hyperlipidemia: Secondary | ICD-10-CM | POA: Diagnosis not present

## 2020-04-13 DIAGNOSIS — I201 Angina pectoris with documented spasm: Secondary | ICD-10-CM | POA: Diagnosis not present

## 2020-04-13 DIAGNOSIS — M79604 Pain in right leg: Secondary | ICD-10-CM | POA: Diagnosis not present

## 2020-04-13 DIAGNOSIS — E1169 Type 2 diabetes mellitus with other specified complication: Secondary | ICD-10-CM | POA: Diagnosis not present

## 2020-04-13 DIAGNOSIS — E785 Hyperlipidemia, unspecified: Secondary | ICD-10-CM | POA: Diagnosis not present

## 2020-04-14 DIAGNOSIS — M722 Plantar fascial fibromatosis: Secondary | ICD-10-CM | POA: Diagnosis not present

## 2020-04-14 DIAGNOSIS — E1142 Type 2 diabetes mellitus with diabetic polyneuropathy: Secondary | ICD-10-CM | POA: Diagnosis not present

## 2020-04-22 DIAGNOSIS — D519 Vitamin B12 deficiency anemia, unspecified: Secondary | ICD-10-CM | POA: Diagnosis not present

## 2020-05-04 DIAGNOSIS — E039 Hypothyroidism, unspecified: Secondary | ICD-10-CM | POA: Diagnosis not present

## 2020-05-04 DIAGNOSIS — E782 Mixed hyperlipidemia: Secondary | ICD-10-CM | POA: Diagnosis not present

## 2020-05-04 DIAGNOSIS — E1169 Type 2 diabetes mellitus with other specified complication: Secondary | ICD-10-CM | POA: Diagnosis not present

## 2020-06-04 DIAGNOSIS — E039 Hypothyroidism, unspecified: Secondary | ICD-10-CM | POA: Diagnosis not present

## 2020-06-04 DIAGNOSIS — E1169 Type 2 diabetes mellitus with other specified complication: Secondary | ICD-10-CM | POA: Diagnosis not present

## 2020-06-04 DIAGNOSIS — E782 Mixed hyperlipidemia: Secondary | ICD-10-CM | POA: Diagnosis not present

## 2020-06-14 DIAGNOSIS — M722 Plantar fascial fibromatosis: Secondary | ICD-10-CM | POA: Diagnosis not present

## 2020-06-14 DIAGNOSIS — M6702 Short Achilles tendon (acquired), left ankle: Secondary | ICD-10-CM | POA: Diagnosis not present

## 2020-06-14 DIAGNOSIS — E1142 Type 2 diabetes mellitus with diabetic polyneuropathy: Secondary | ICD-10-CM | POA: Diagnosis not present

## 2020-07-19 DIAGNOSIS — E1142 Type 2 diabetes mellitus with diabetic polyneuropathy: Secondary | ICD-10-CM | POA: Diagnosis not present

## 2020-07-19 DIAGNOSIS — M6702 Short Achilles tendon (acquired), left ankle: Secondary | ICD-10-CM | POA: Diagnosis not present

## 2020-07-19 DIAGNOSIS — M722 Plantar fascial fibromatosis: Secondary | ICD-10-CM | POA: Diagnosis not present

## 2020-07-28 ENCOUNTER — Other Ambulatory Visit: Payer: Self-pay | Admitting: Cardiology

## 2020-07-28 DIAGNOSIS — I2510 Atherosclerotic heart disease of native coronary artery without angina pectoris: Secondary | ICD-10-CM

## 2020-07-28 NOTE — Telephone Encounter (Signed)
Needs appointment for further refills

## 2020-08-04 DIAGNOSIS — E782 Mixed hyperlipidemia: Secondary | ICD-10-CM | POA: Diagnosis not present

## 2020-08-04 DIAGNOSIS — E039 Hypothyroidism, unspecified: Secondary | ICD-10-CM | POA: Diagnosis not present

## 2020-08-04 DIAGNOSIS — E1169 Type 2 diabetes mellitus with other specified complication: Secondary | ICD-10-CM | POA: Diagnosis not present

## 2020-08-10 DIAGNOSIS — D519 Vitamin B12 deficiency anemia, unspecified: Secondary | ICD-10-CM | POA: Diagnosis not present

## 2020-08-10 NOTE — Progress Notes (Signed)
Cardiology Office Note:    Date:  08/11/2020   ID:  Craig Armstrong, DOB 1940/09/17, MRN [id number]  PCP:  Lise Auer, MD  Cardiologist:  Norman Herrlich, MD    Referring MD: Lise Auer, MD    ASSESSMENT:    1. CAD in native artery   2. Essential hypertension   3. Mixed hyperlipidemia    PLAN:    In order of problems listed above:  He is present in the office with acute coronary syndrome rapid progression his anginal pattern having angina with activity and at rest he has an accelerated junctional rhythm abnormal EKG with bundle branch block and may require EMS transfer [facility] quick evaluation and perhaps even need coronary angiography today.   Next appointment: As needed depending on results of his urgent care today   Medication Adjustments/Labs and Tests Ordered: Current medicines are reviewed at length with the patient today.  Concerns regarding medicines are outlined above.  No orders of the defined types were placed in this encounter.  No orders of the defined types were placed in this encounter.   Chief Complaint  Patient presents with   Follow-up   Coronary Artery Disease    History of Present Illness:    Craig Armstrong is a 80 y.o. male with a hx of CAD mild nonobstructive on coronary angiography in 2017 subsequent cardiac CTA 03/26/2018 showed a very high coronary artery calcium score 385 and the presence of possible severe CAD in the mid left anterior descending coronary artery although fractional flow reserve was reported as normal.  He elected medical therapy when last seen 04/10/2018 including aspirin high intensity statin diltiazem and as needed nitroglycerin.  Compliance with diet, lifestyle and medications: No he never refilled diltiazem or picked up a prescription for nitroglycerin  He has had a marked change in his pattern of angina he actually went to the [facility] earlier this week and was unable to walk on a trail. In the last 6  months he has increasing frequency of angina and not only occurs with effort relieved with rest but occurs at rest and just tends to wax and wane throughout the day he describes a substernal pain pressure and shortness of breath.  Previously quickly relieved with rest and now it takes up to 30 minutes.  He had chest pain walking into the office today and still has it.  His EKG in my office shows underlying right bundle branch block left axis deviation but he also has retrograde P waves junctional rhythm 69 bpm.  I told him this is a cardiovascular emergency he has unstable angina and he has a rhythm of accelerated junctional rhythm likely ischemic On has not given him nitroglycerin with bradycardia in the office we will monitor with the EKG machine call EMS transfer to [facility].  I will call the card master.  He may require heart catheterization today.  He has been lightheaded but has not had syncope.  09/02/2015: Conclusions  Diagnostic Procedure Summary  Mild non obstructive CAD  Normal LV function and EDP  Diagnostic Procedure Recommendations  Medical therapy  I have reviewed the recent history and physical documentation. I personally  spent 16 minutes continuously monitoring the patient during the administration  of moderate sedation. Pre and post activities have been reviewed. I was  present for the entire procedure.   Signatures   Electronically signed by Therisa Doyne, MD(Diagnostic   Physician) on 09/02/2015 14:28   Angiographic findings  Cardiac Arteries and Lesion Findings  LMCA: Mild luminal irregularities < 20%  LAD: Mild luminal irregularities < 20%  LCx: Mild luminal irregularities < 20%  RCA: Mild luminal irregularities < 20%  Procedure Data  Procedure Date   Past Medical History:  Diagnosis Date   Chest pain 08/25/2015   Essential hypertension 02/27/2018   Hyperlipidemia 08/31/2015   Mild CAD 02/27/2018   Type 2 diabetes mellitus (HCC) 08/31/2015    Type 2 diabetes mellitus with insulin therapy (HCC)    Unstable angina (HCC) 08/31/2015    Past Surgical History:  Procedure Laterality Date   APPENDECTOMY     CARDIAC CATHETERIZATION     HERNIA REPAIR     TONSILLECTOMY      Current Medications: Current Meds  Medication Sig   aspirin EC 81 MG tablet Take 81 mg by mouth daily.   Cholecalciferol (VITAMIN D3 PO) Take 2,000 Units by mouth daily.   Coenzyme Q10 300 MG CAPS Take 1 capsule by mouth daily.   cyanocobalamin (,VITAMIN B-12,) 1000 MCG/ML injection Inject 1,000 mcg into the muscle every 30 (thirty) days.   insulin degludec (TRESIBA FLEXTOUCH) 100 UNIT/ML FlexTouch Pen Inject 20 Units into the skin daily.   levothyroxine (SYNTHROID, LEVOTHROID) 112 MCG tablet Take 112 mcg by mouth daily before breakfast.    mometasone (NASONEX) 50 MCG/ACT nasal spray Place 1 spray into the nose daily as needed for congestion.   nitroGLYCERIN (NITROSTAT) 0.4 MG SL tablet Place 1 tablet (0.4 mg total) under the tongue every 5 (five) minutes as needed for chest pain (use SL prn angina, repeat in 5 minutes if needed).   NON FORMULARY VITAL REDS-1 scoop daily   rosuvastatin (CRESTOR) 5 MG tablet Take 5 mg by mouth daily.   Semaglutide,0.25 or 0.5MG /DOS, (OZEMPIC, 0.25 OR 0.5 MG/DOSE,) 2 MG/1.5ML SOPN Inject 0.25 mg into the skin once a week.   tamsulosin (FLOMAX) 0.4 MG CAPS capsule Take 0.4 mg by mouth daily.   Zinc 100 MG TABS Take 100 mg by mouth daily.     Allergies:   Atorvastatin, Lisinopril, Ranexa [ranolazine], Zocor [simvastatin], and Sulfa antibiotics   Social History   Socioeconomic History   Marital status: Married    Spouse name: Not on file   Number of children: Not on file   Years of education: Not on file   Highest education level: Not on file  Occupational History   Not on file  Tobacco Use   Smoking status: Never   Smokeless tobacco: Never  Vaping Use   Vaping Use: Never used  Substance and Sexual Activity   Alcohol  use: Not Currently   Drug use: Not Currently   Sexual activity: Not on file  Other Topics Concern   Not on file  Social History Narrative   Not on file   Social Determinants of Health   Financial Resource Strain: Not on file  Food Insecurity: Not on file  Transportation Needs: Not on file  Physical Activity: Not on file  Stress: Not on file  Social Connections: Not on file     Family History: The patient's family history includes Diabetes in his father; Heart attack in his maternal grandfather; Hyperlipidemia in his mother; Thyroid cancer in his mother. ROS:   Please see the history of present illness.    All other systems reviewed and are negative.  EKGs/Labs/Other Studies Reviewed:    The following studies were reviewed today:  EKG:  EKG ordered today and personally reviewed.  The ekg  ordered today demonstrates right bundle branch block junctional rhythm 69 bpm  Recent Labs: none  Physical Exam:    VS:  BP 100/90 (BP Location: Right Arm, Patient Position: Sitting, Cuff Size: Normal)   Ht 6' (1.829 m)   Wt 206 lb (93.4 kg)   BMI 27.94 kg/m     Wt Readings from Last 3 Encounters:  08/11/20 206 lb (93.4 kg)  04/10/18 201 lb (91.2 kg)  03/27/18 203 lb 3.2 oz (92.2 kg)     GEN: Does not appear to be in severe distress is not vasoconstricted well nourished, well developed in no acute distress HEENT: Normal NECK: No JVD; No carotid bruits LYMPHATICS: No lymphadenopathy CARDIAC: RRR, no murmurs, rubs, gallops RESPIRATORY:  Clear to auscultation without rales, wheezing or rhonchi  ABDOMEN: Soft, non-tender, non-distended MUSCULOSKELETAL:  No edema; No deformity  SKIN: Warm and dry NEUROLOGIC:  Alert and oriented x 3 PSYCHIATRIC:  Normal affect    Signed, Norman Herrlich, MD  08/11/2020 10:59 AM    [facility]

## 2020-08-11 ENCOUNTER — Ambulatory Visit: Payer: Medicare Other | Admitting: Cardiology

## 2020-08-11 ENCOUNTER — Observation Stay (HOSPITAL_COMMUNITY)
Admission: EM | Admit: 2020-08-11 | Discharge: 2020-08-13 | Disposition: A | Discharge status: Home / Self Care | Payer: Medicare Other | Attending: Cardiology | Admitting: Cardiology

## 2020-08-11 ENCOUNTER — Other Ambulatory Visit: Payer: Self-pay

## 2020-08-11 ENCOUNTER — Encounter: Payer: Self-pay | Admitting: Cardiology

## 2020-08-11 ENCOUNTER — Encounter (HOSPITAL_COMMUNITY): Payer: Self-pay | Admitting: Emergency Medicine

## 2020-08-11 ENCOUNTER — Emergency Department (HOSPITAL_COMMUNITY): Payer: Medicare Other

## 2020-08-11 VITALS — BP 100/90 | HR 69 | Ht 72.0 in | Wt 206.0 lb

## 2020-08-11 DIAGNOSIS — I25110 Atherosclerotic heart disease of native coronary artery with unstable angina pectoris: Principal | ICD-10-CM | POA: Insufficient documentation

## 2020-08-11 DIAGNOSIS — Z79899 Other long term (current) drug therapy: Secondary | ICD-10-CM | POA: Diagnosis not present

## 2020-08-11 DIAGNOSIS — I498 Other specified cardiac arrhythmias: Secondary | ICD-10-CM | POA: Diagnosis present

## 2020-08-11 DIAGNOSIS — Z7982 Long term (current) use of aspirin: Secondary | ICD-10-CM | POA: Insufficient documentation

## 2020-08-11 DIAGNOSIS — I10 Essential (primary) hypertension: Secondary | ICD-10-CM | POA: Insufficient documentation

## 2020-08-11 DIAGNOSIS — I200 Unstable angina: Secondary | ICD-10-CM

## 2020-08-11 DIAGNOSIS — R001 Bradycardia, unspecified: Secondary | ICD-10-CM | POA: Diagnosis not present

## 2020-08-11 DIAGNOSIS — E119 Type 2 diabetes mellitus without complications: Secondary | ICD-10-CM | POA: Insufficient documentation

## 2020-08-11 DIAGNOSIS — I2510 Atherosclerotic heart disease of native coronary artery without angina pectoris: Secondary | ICD-10-CM

## 2020-08-11 DIAGNOSIS — Z794 Long term (current) use of insulin: Secondary | ICD-10-CM | POA: Diagnosis not present

## 2020-08-11 DIAGNOSIS — U071 COVID-19: Secondary | ICD-10-CM | POA: Diagnosis not present

## 2020-08-11 DIAGNOSIS — I4510 Unspecified right bundle-branch block: Secondary | ICD-10-CM | POA: Diagnosis present

## 2020-08-11 DIAGNOSIS — R0602 Shortness of breath: Secondary | ICD-10-CM | POA: Diagnosis not present

## 2020-08-11 DIAGNOSIS — E782 Mixed hyperlipidemia: Secondary | ICD-10-CM | POA: Diagnosis not present

## 2020-08-11 DIAGNOSIS — R079 Chest pain, unspecified: Secondary | ICD-10-CM | POA: Diagnosis not present

## 2020-08-11 DIAGNOSIS — E785 Hyperlipidemia, unspecified: Secondary | ICD-10-CM | POA: Diagnosis present

## 2020-08-11 LAB — TROPONIN I (HIGH SENSITIVITY)
Troponin I (High Sensitivity): 5 ng/L (ref <18)
Troponin I (High Sensitivity): 5 ng/L (ref <18)
Troponin I (High Sensitivity): 5 ng/L (ref <18)

## 2020-08-11 LAB — HEMOGLOBIN A1C
Hgb A1c MFr Bld: 8 % — ABNORMAL HIGH (ref 4.8–5.6)
Hgb A1c MFr Bld: 7.9 % — ABNORMAL HIGH (ref 4.8–5.6)
Mean Plasma Glucose: 182.9 mg/dL
Mean Plasma Glucose: 180.03 mg/dL

## 2020-08-11 LAB — BASIC METABOLIC PANEL
Anion gap: 4 — ABNORMAL LOW (ref 5–15)
BUN: 24 mg/dL — ABNORMAL HIGH (ref 8–23)
CO2: 26 mmol/L (ref 22–32)
Calcium: 9.3 mg/dL (ref 8.9–10.3)
Chloride: 107 mmol/L (ref 98–111)
Creatinine, Ser: 1.32 mg/dL — ABNORMAL HIGH (ref 0.61–1.24)
GFR, Estimated: 55 mL/min — ABNORMAL LOW (ref >60)
Glucose, Bld: 130 mg/dL — ABNORMAL HIGH (ref 70–99)
Potassium: 4.9 mmol/L (ref 3.5–5.1)
Sodium: 137 mmol/L (ref 135–145)

## 2020-08-11 LAB — CBG MONITORING, ED
Glucose-Capillary: 117 mg/dL — ABNORMAL HIGH (ref 70–99)
Glucose-Capillary: 101 mg/dL — ABNORMAL HIGH (ref 70–99)

## 2020-08-11 LAB — CBC
HCT: 43.1 % (ref 39.0–52.0)
Hemoglobin: 14.4 g/dL (ref 13.0–17.0)
MCH: 30.3 pg (ref 26.0–34.0)
MCHC: 33.4 g/dL (ref 30.0–36.0)
MCV: 90.5 fL (ref 80.0–100.0)
Platelets: 148 10*3/uL — ABNORMAL LOW (ref 150–400)
RBC: 4.76 MIL/uL (ref 4.22–5.81)
RDW: 12.3 % (ref 11.5–15.5)
WBC: 6.1 10*3/uL (ref 4.0–10.5)
nRBC: 0 % (ref 0.0–0.2)

## 2020-08-11 LAB — RESP PANEL BY RT-PCR (FLU A&B, COVID) ARPGX2
Influenza A by PCR: NEGATIVE
Influenza B by PCR: NEGATIVE
SARS Coronavirus 2 by RT PCR: POSITIVE — AB

## 2020-08-11 LAB — TSH: TSH: 1.523 u[IU]/mL (ref 0.350–4.500)

## 2020-08-11 MED ORDER — INSULIN DEGLUDEC 100 UNIT/ML ~~LOC~~ SOPN: 20.0000 [IU] | PEN_INJECTOR | Freq: Every day | SUBCUTANEOUS | Status: DC

## 2020-08-11 MED ORDER — SODIUM CHLORIDE 0.9% FLUSH: 3.0000 mL | Freq: Two times a day (BID) | INTRAVENOUS | Status: DC

## 2020-08-11 MED ORDER — ACETAMINOPHEN 325 MG PO TABS: 650.0000 mg | ORAL_TABLET | ORAL | Status: DC | PRN

## 2020-08-11 MED ORDER — SODIUM CHLORIDE 0.9 % WEIGHT BASED INFUSION
3.0000 mL/kg/h | INTRAVENOUS | Status: DC
  Administered 2020-08-12: 3 mL/kg/h via INTRAVENOUS

## 2020-08-11 MED ORDER — VITAMIN D 25 MCG (1000 UNIT) PO TABS
2000.0000 [IU] | ORAL_TABLET | Freq: Every day | ORAL | Status: DC
  Administered 2020-08-13: 2000 [IU] via ORAL
  Filled 2020-08-11 (×2): qty 2

## 2020-08-11 MED ORDER — ROSUVASTATIN CALCIUM 5 MG PO TABS
5.0000 mg | ORAL_TABLET | Freq: Every day | ORAL | Status: DC
  Administered 2020-08-11: 5 mg via ORAL
  Filled 2020-08-11 (×3): qty 1

## 2020-08-11 MED ORDER — ONDANSETRON HCL 4 MG/2ML IJ SOLN: 4.0000 mg | Freq: Four times a day (QID) | INTRAMUSCULAR | Status: DC | PRN

## 2020-08-11 MED ORDER — ALPRAZOLAM 0.25 MG PO TABS
0.2500 mg | ORAL_TABLET | Freq: Two times a day (BID) | ORAL | Status: DC | PRN
  Administered 2020-08-11 – 2020-08-12 (×2): 0.25 mg via ORAL
  Filled 2020-08-11 (×2): qty 1

## 2020-08-11 MED ORDER — ASPIRIN EC 81 MG PO TBEC
81.0000 mg | DELAYED_RELEASE_TABLET | Freq: Every day | ORAL | Status: DC
  Administered 2020-08-13: 81 mg via ORAL
  Filled 2020-08-11 (×3): qty 1

## 2020-08-11 MED ORDER — ZINC 100 MG PO TABS: 100.0000 mg | ORAL_TABLET | Freq: Every day | ORAL | Status: DC

## 2020-08-11 MED ORDER — SODIUM CHLORIDE 0.9 % IV BOLUS
1000.0000 mL | Freq: Once | INTRAVENOUS | Status: AC
  Administered 2020-08-11: 1000 mL via INTRAVENOUS

## 2020-08-11 MED ORDER — HEPARIN BOLUS VIA INFUSION
4000.0000 [IU] | Freq: Once | INTRAVENOUS | Status: AC
  Administered 2020-08-11: 4000 [IU] via INTRAVENOUS
  Filled 2020-08-11: qty 4000

## 2020-08-11 MED ORDER — SODIUM CHLORIDE 0.9 % WEIGHT BASED INFUSION
1.0000 mL/kg/h | INTRAVENOUS | Status: DC
  Administered 2020-08-12: 1 mL/kg/h via INTRAVENOUS

## 2020-08-11 MED ORDER — INSULIN GLARGINE 100 UNIT/ML ~~LOC~~ SOLN
20.0000 [IU] | Freq: Every day | SUBCUTANEOUS | Status: DC
  Filled 2020-08-11 (×2): qty 0.2

## 2020-08-11 MED ORDER — COENZYME Q10 300 MG PO CAPS: 1.0000 | ORAL_CAPSULE | Freq: Every day | ORAL | Status: DC

## 2020-08-11 MED ORDER — SODIUM CHLORIDE 0.9 % IV SOLN: 250.0000 mL | INTRAVENOUS | Status: DC | PRN

## 2020-08-11 MED ORDER — SODIUM CHLORIDE 0.9 % IV SOLN: INTRAVENOUS | Status: DC

## 2020-08-11 MED ORDER — ZOLPIDEM TARTRATE 5 MG PO TABS
5.0000 mg | ORAL_TABLET | Freq: Every evening | ORAL | Status: DC | PRN
  Administered 2020-08-11: 5 mg via ORAL
  Filled 2020-08-11: qty 1

## 2020-08-11 MED ORDER — SODIUM CHLORIDE 0.9% FLUSH: 3.0000 mL | INTRAVENOUS | Status: DC | PRN

## 2020-08-11 MED ORDER — LEVOTHYROXINE SODIUM 112 MCG PO TABS
112.0000 ug | ORAL_TABLET | Freq: Every day | ORAL | Status: DC
  Administered 2020-08-12 – 2020-08-13 (×2): 112 ug via ORAL
  Filled 2020-08-11 (×3): qty 1

## 2020-08-11 MED ORDER — FLUTICASONE PROPIONATE 50 MCG/ACT NA SUSP
1.0000 | Freq: Every day | NASAL | Status: DC
  Administered 2020-08-12: 1 via NASAL
  Filled 2020-08-11: qty 16

## 2020-08-11 MED ORDER — TAMSULOSIN HCL 0.4 MG PO CAPS
0.4000 mg | ORAL_CAPSULE | Freq: Every day | ORAL | Status: DC
  Administered 2020-08-11 – 2020-08-13 (×3): 0.4 mg via ORAL
  Filled 2020-08-11 (×3): qty 1

## 2020-08-11 MED ORDER — INSULIN ASPART 100 UNIT/ML IJ SOLN: 0.0000 [IU] | Freq: Three times a day (TID) | INTRAMUSCULAR | Status: DC

## 2020-08-11 MED ORDER — ASPIRIN 81 MG PO CHEW
81.0000 mg | CHEWABLE_TABLET | ORAL | Status: AC
  Administered 2020-08-12: 81 mg via ORAL
  Filled 2020-08-11: qty 1

## 2020-08-11 MED ORDER — HEPARIN (PORCINE) 25000 UT/250ML-% IV SOLN
1200.0000 [IU]/h | INTRAVENOUS | Status: DC
  Administered 2020-08-11: 1200 [IU]/h via INTRAVENOUS
  Filled 2020-08-11 (×2): qty 250

## 2020-08-11 MED ORDER — NITROGLYCERIN 0.4 MG SL SUBL: 0.4000 mg | SUBLINGUAL_TABLET | SUBLINGUAL | Status: DC | PRN

## 2020-08-11 NOTE — H&P (Addendum)
Cardiology Admission History and Physical:   Patient ID: Norvell Caswell MRN: [id number]; DOB: 1940/09/20   Admission date: 08/11/2020  PCP:  Lise Auer, MD   [facility] Providers Cardiologist:  Norman Herrlich, MD     Chief Complaint:  Unstable angina  Patient Profile:   Demani Mcbrien is a 80 y.o. male with elevated Ca++ score and mod-severe mid LAD & Diag dz by cardiac CT 03/2018 w/ neg FFR, non-obs CAD at cath 2017, HTN, HLD, IDDM II, RBBB, who is being seen 08/11/2020 for the evaluation of chest pain.  History of Present Illness:   Mr. Huster started noticing an increase in his chest pain about 4 months ago.   The level of exertion required to bring on the chest pain steadily decreased. It was not the distance walked as much as the intensity of the exertion that would bring on the pain. He has also had some episodes of pain that started at rest.   The pain would be central and go shoulder to shoulder. Sometimes it went down his L arm. Squeezing in nature at first, it morphed into a heaviness. The worst episode reached a 9-1/2 and happened yesterday, after walking up a flight of steps.  The episodes were generally relieved by rest in just a few minutes. However, the episode yesterday lasted longer, dropping to a 2/10 in a few minutes, but persisting for over 1/2 hour.   +SOB, but no other associated symptoms. Sometimes when the pain is going on, he will cough and that will help.  Because of the increased frequency and intensity of the episodes, he made an appointment with Dr Dulce Sellar for today. At the appointment, he mentioned an episode of chest pain that happened this am. His symptoms were very concerning and he was in a junctional rhythm. He was sent by EMS to [facility] for admission.   He was on vacation with his family last week and got the "flu". He was having fevers and chills. +coughing, +sore throat, cough productive of clear sputum. He has completely recovered. He is  vaccinated, but did not get tested for Covid. His son was also sick, unvaccinated and did not get tested.  He has occasional palpitations, heart skips and longer runs of rapid heart beat. Wore a monitor years ago, by his description had PVCs. No presyncope or syncope.   Past Medical History:  Diagnosis Date   Chest pain 08/25/2015   Essential hypertension 02/27/2018   Hyperlipidemia 08/31/2015   Mild CAD 02/27/2018   Type 2 diabetes mellitus (HCC) 08/31/2015   Type 2 diabetes mellitus with insulin therapy (HCC)    Unstable angina (HCC) 08/31/2015    Past Surgical History:  Procedure Laterality Date   APPENDECTOMY     CARDIAC CATHETERIZATION     HERNIA REPAIR     TONSILLECTOMY       Medications Prior to Admission: Prior to Admission medications   Medication Sig Start Date End Date Taking?  aspirin EC 81 MG tablet Take 81 mg by mouth daily.     Cholecalciferol (VITAMIN D3 PO) Take 2,000 Units by mouth daily.     Coenzyme Q10 300 MG CAPS Take 1 capsule by mouth daily.     cyanocobalamin (,VITAMIN B-12,) 1000 MCG/ML injection Inject 1,000 mcg into the muscle every 30 (thirty) days.     insulin degludec (TRESIBA FLEXTOUCH) 100 UNIT/ML FlexTouch Pen Inject 20 Units into the skin daily. 02/05/18    levothyroxine (SYNTHROID, LEVOTHROID) 112 MCG tablet Take 112  mcg by mouth daily before breakfast.  12/18/17    mometasone (NASONEX) 50 MCG/ACT nasal spray Place 1 spray into the nose daily as needed for congestion. 07/23/08    nitroGLYCERIN (NITROSTAT) 0.4 MG SL tablet Place 1 tablet (0.4 mg total) under the tongue every 5 (five) minutes as needed for chest pain (use SL prn angina, repeat in 5 minutes if needed). 03/28/18    NON FORMULARY VITAL REDS-1 scoop daily     rosuvastatin (CRESTOR) 5 MG tablet Take 5 mg by mouth daily.     Semaglutide,0.25 or 0.5MG /DOS, (OZEMPIC, 0.25 OR 0.5 MG/DOSE,) 2 MG/1.5ML SOPN Inject 0.25 mg into the skin once a week. 08/10/20    tamsulosin (FLOMAX) 0.4 MG CAPS  capsule Take 0.4 mg by mouth daily. 01/01/18    Zinc 100 MG TABS Take 100 mg by mouth daily. 05/09/20       Allergies:    Allergies  Allergen Reactions   Atorvastatin Other (See Comments)    Myalgia   Lisinopril     Hypotension   Ranexa [Ranolazine] Other (See Comments)    "extreme fatigue and low heart rate"   Zocor [Simvastatin] Other (See Comments)    Myalgias    Sulfa Antibiotics Other (See Comments)    CONSTIPATION    Social History:   Social History   Socioeconomic History   Marital status: Married    Spouse name: Not on file   Number of children: Not on file   Years of education: Not on file   Highest education level: Not on file  Occupational History   Not on file  Tobacco Use   Smoking status: Never   Smokeless tobacco: Never  Vaping Use   Vaping Use: Never used  Substance and Sexual Activity   Alcohol use: Not Currently   Drug use: Not Currently   Sexual activity: Not on file  Other Topics Concern   Not on file  Social History Narrative   Not on file   Social Determinants of Health   Financial Resource Strain: Not on file  Food Insecurity: Not on file  Transportation Needs: Not on file  Physical Activity: Not on file  Stress: Not on file  Social Connections: Not on file  Intimate Partner Violence: Not on file    Family History:   The patient's family history includes Diabetes in his father; Heart attack in his maternal grandfather; Hyperlipidemia in his mother; Thyroid cancer in his mother.   Family Status  Relation Name Status   Mother  Deceased   Father  Deceased   MGF  Deceased    ROS:  Please see the history of present illness.  All other ROS reviewed and negative.     Physical Exam/Data:   Vitals:   08/11/20 1530 08/11/20 1545 08/11/20 1615 08/11/20 1630  BP: 135/81 121/81 120/87 (!) 142/73  Pulse: (!) 48 (!) 45 (!) 53 (!) 55  Resp: 19 10 17 12   Temp:      TempSrc:      SpO2: 97% 98% 98% 97%   No intake or output data in the  24 hours ending 08/11/20 1659 Last 3 Weights 08/11/2020 04/10/2018 03/27/2018  Weight (lbs) 206 lb 201 lb 203 lb 3.2 oz  Weight (kg) 93.441 kg 91.173 kg 92.171 kg     There is no height or weight on file to calculate BMI.  General:  Well nourished, well developed, in no acute distress HEENT: normal Lymph: no adenopathy Neck: no JVD Endocrine:  No thryomegaly Vascular: No carotid bruits; 4/4 extremity pulses 2+ bilaterally Cardiac:  normal S1, S2; RRR; no murmur  Lungs:  clear to auscultation bilaterally, no wheezing, rhonchi or rales  Abd: soft, nontender, no hepatomegaly  Ext: no edema Musculoskeletal:  No deformities, BUE and BLE strength normal and equal Skin: warm and dry  Neuro:  CNs 2-12 intact, no focal abnormalities noted Psych:  Normal affect    EKG:  The ECG that was done today was personally reviewed and demonstrates  [facility] is Junctional rhythm w/ RBBB, HR 69 [place] ECG is Sinus brady, HR 53, RBBB  Relevant CV Studies: FINDINGS: Coronary calcium score: The patient's coronary artery calcium score is 385, which places the patient in the 57th percentile. CARDIAC CTA w/ FFR: 03/26/2018 Coronary arteries: Normal coronary origins.  Right dominance.   Right Coronary Artery: No significant plaque or stenosis.   Left Main Coronary Artery: No significant plaque or stenosis.   Left Anterior Descending Coronary Artery: Mild ostial LAD stenosis (25-49% stenosis). Moderate calcified proximal LAD plaque (50-69% stenosis). Possible severe ostial diagonal 1 stenosis. Possible two severe serial calcified lesions in the mid LAD (70-99% stenosis) maybe overestimated due to calcium blooming.   Left Circumflex Artery: No significant plaque or stenosis. Mild stenosis in large caliber OM2   Aorta:  Normal size.  No calcifications.  No dissection.   Aortic Valve: Mild annular calcifications.   Other findings:   Normal pulmonary vein drainage into the left atrium.    Normal left atrial appendage without a thrombus.   Normal size of the pulmonary artery.   IMPRESSION: 1. The patient's coronary artery calcium score is 385, which places the patient in the 57th percentile.   2. Normal coronary origin with right dominance.   3. Possible severe CAD in the mid LAD, CADRADS = 4. CT FFR analysis will be performed and reported separately.  1. Left Main:  No significant stenosis. FFR = 0.99   2. LAD: No significant stenosis. Proximal FFR = 0.87, Mid FFR = 0.87, Distal FFR = 0.81, D1 = 0.88 3. LCX: No significant stenosis. Proximal FFR = 0.99, Distal FFR = 0.82, OM = 0.90 4. RCA: No significant stenosis. Proximal FFR = 0.98, Mid FFR = 0.96, Distal FFR = 0.91   IMPRESSION: 1.  CT FFR analysis didn't show any significant stenosis.  Laboratory Data:  High Sensitivity Troponin:   Recent Labs  Lab 08/11/20 1238 08/11/20 1449  TROPONINIHS 5 5      Chemistry Recent Labs  Lab 08/11/20 1238  NA 137  K 4.9  CL 107  CO2 26  GLUCOSE 130*  BUN 24*  CREATININE 1.32*  CALCIUM 9.3  GFRNONAA 55*  ANIONGAP 4*    No results for input(s): PROT, ALBUMIN, AST, ALT, ALKPHOS, BILITOT in the last 168 hours. Hematology Recent Labs  Lab 08/11/20 1238  WBC 6.1  RBC 4.76  HGB 14.4  HCT 43.1  MCV 90.5  MCH 30.3  MCHC 33.4  RDW 12.3  PLT 148*   BNPNo results for input(s): BNP, PROBNP in the last 168 hours.  DDimer No results for input(s): DDIMER in the last 168 hours. No results found for: HGBA1C No results found for: CHOL, HDL, LDLCALC, LDLDIRECT, TRIG, CHOLHDL No results found for: TSH   Radiology/Studies:  DG Chest 2 View  Result Date: 08/11/2020 CLINICAL DATA:  Chest pain and shortness of breath over the last week. EXAM: CHEST - 2 VIEW COMPARISON:  06/03/2017 FINDINGS: Heart  size is normal. Chronic aortic atherosclerosis. Mild chronic markings at the lung bases are unchanged. No sign of active infiltrate, mass, effusion or collapse. No edema.  IMPRESSION: No active disease. Aortic atherosclerosis. Mild chronic markings at the lung bases. Electronically Signed   By: Paulina Fusi M.D.   On: 08/11/2020 13:33     Assessment and Plan:   Chest pain - ez neg MI  - ECG w/ RBBB (old), but initial ECG was junctional rhythm - currently sinus brady 40s and asymptomatic - admit for cath - w/ mod-severe dz in LAD system 2020 and mult CRFs, will add heparin - if recurrent pain, add nitro - ck echo - Cardiac catheterization was discussed with the patient fully. The patient understands that risks include but are not limited to stroke (1 in 1000), death (1 in 1000), kidney failure [usually temporary] (1 in 500), bleeding (1 in 200), allergic reaction [possibly serious] (1 in 200).  The patient understands and is willing to proceed.    2. DM - continue home rx except Tresiba and add SSI - ck A1c  3. HLD - He had myalgias w/ Lipitor and Zocor - currently tolerating Crestor 5 mg qd - continue this, ck profile  4. HTN - BP is low right now, responded to IVF - had taken Cardizem briefly, not on now - Flomax can cause orthostatic hypotension, ck orthostatic VS  5. Hypothyroid - ck TSH, continue synthroid  6. Recent URI - pt is vaccinated but he and his son (unvaccinated) got a URI while on vacation last week - sx have resolved - no fever now, WBCs ok - f/u on COVID test  7. Palpitations - pt has hx skipping beats, regular PVCs on telemetry - however, also has longer runs of palpitations - follow on telemetry, may need monitor at d/c  8. CKD III - GFR was 55 in 2020 and is 55 today - he is not on ARB/ACE - start hydration now for cath in am   Risk Assessment/Risk Scores:    TIMI Risk Score for Unstable Angina or Non-ST Elevation MI:   The patient's TIMI risk score is 5, which indicates a 26% risk of all cause mortality, new or recurrent myocardial infarction or need for urgent revascularization in the next 14 days.    For  questions or updates, please contact [facility] Please consult [email or web address] for contact info under     Signed, Theodore Demark, PA-C  08/11/2020 4:59 PM  As above, patient seen and examined.  Briefly he is a 80 year old male with past medical history of coronary artery disease-based on previous abnormal CTA, diabetes mellitus, hypertension, hyperlipidemia, bradycardia with unstable angina.  Cardiac CTA February 2020 showed calcium score 385 and possible severe stenosis in the mid LAD.  He has been treated medically.  He initially described exertional chest pain (substernal chest pressure without radiation) occurring with exertion relieved with rest.  However over the past 6 months his symptoms have worsened with less activity and now are occurring at rest.  He was seen by Dr. Dulce Sellar and cardiac catheterization recommended.  He presented to the emergency room for further evaluation.  Presently pain-free. Creatinine 1.32, hemoglobin 14.4.  Troponin 5 and 5.  Initial electrocardiogram shows probable ventricular escape rhythm at a rate of 69 with retrograde P waves.  Follow-up ECG shows sinus bradycardia with right bundle branch block and no ST changes.  1 unstable angina-patient presents with classic symptoms of unstable angina.  We will treat with  aspirin and heparin.  Add IV nitroglycerin if he has recurrent symptoms.  No beta-blocker in setting of bradycardia.  Continue low-dose statin.  Patient will require cardiac catheterization.  The risk and benefits including myocardial infarction, CVA and death discussed and he agrees to proceed.  Hydrate prior to catheterization with no ventriculogram given mild renal insufficiency.  Schedule echocardiogram to assess LV function.  2 bradycardia-electrocardiogram from Dr. Hulen Shouts office showed ventricular rhythm with retrograde P waves at a rate of 69.  Patient is on no AV nodal blocking agents.  Will review with electrophysiology.  Question if this suggest  need for pacemaker given infrahisian origin.  Follow on telemetry.  3 hyperlipidemia-continue low-dose statin.  Patient had intolerance to Lipitor and Zocor previously.  Check lipids.  If LDL greater than 70 we will try to advance Crestor.  4 chronic stage IIIa kidney disease-hydrate prior to catheterization and follow renal function after.  5 hypertension-follow blood pressure and adjust regimen as needed.  6 diabetes mellitus-follow CBGs.  Olga Millers, MD

## 2020-08-11 NOTE — ED Provider Notes (Signed)
[facility] EMERGENCY DEPARTMENT Provider Note   CSN: [id number] Arrival date & time: 08/11/20  1223     History Chief Complaint  Patient presents with   Chest Pain    Craig Armstrong is a 80 y.o. male.  80 year old male with past medical history below including CAD, HTN, HLD, T2DM who p/w chest pain.  Patient reports that for the past few months he has been having intermittent chest pain episodes that have been more frequent recently and occurring with less exertional activity.  He states that when he is having the chest pain, it is a central pain that spreads across his chest, occasionally radiates to his back, and is worse with exertion, improves with rest.  The pain sometimes takes his breath away but he denies any shortness of breath.  No associated nausea, vomiting, or diaphoresis.  He is currently chest pain-free.  He saw his cardiologist Dr. Dortha Kern in the clinic today and he was sent here for further cardiology evaluation.  He was given aspirin by EMS.  Because he has been chest pain-free, no nitroglycerin has been given.  He had a cough/cold illness briefly a week ago but symptoms completely resolved.  He occasionally has LE edema.   The history is provided by the patient.  Chest Pain     Past Medical History:  Diagnosis Date   Chest pain 08/25/2015   Essential hypertension 02/27/2018   Hyperlipidemia 08/31/2015   Mild CAD 02/27/2018   Type 2 diabetes mellitus (HCC) 08/31/2015   Type 2 diabetes mellitus with insulin therapy (HCC)    Unstable angina (HCC) 08/31/2015    Patient Active Problem List   Diagnosis Date Noted   CAD in native artery 02/27/2018   Essential hypertension 02/27/2018   Type 2 diabetes mellitus (HCC) 08/31/2015   Hyperlipidemia 08/31/2015   Unstable angina (HCC) 08/31/2015   Chest pain 08/25/2015    Past Surgical History:  Procedure Laterality Date   APPENDECTOMY     CARDIAC CATHETERIZATION     HERNIA REPAIR      TONSILLECTOMY         Family History  Problem Relation Age of Onset   Hyperlipidemia Mother    Thyroid cancer Mother    Diabetes Father    Heart attack Maternal Grandfather     Social History   Tobacco Use   Smoking status: Never   Smokeless tobacco: Never  Vaping Use   Vaping Use: Never used  Substance Use Topics   Alcohol use: Not Currently   Drug use: Not Currently    Home Medications Prior to Admission medications   Medication Sig Start Date End Date Taking? Authorizing Provider  aspirin EC 81 MG tablet Take 81 mg by mouth daily.    [provider]  Cholecalciferol (VITAMIN D3 PO) Take 2,000 Units by mouth daily.    [provider]  Coenzyme Q10 300 MG CAPS Take 1 capsule by mouth daily.    [provider]  cyanocobalamin (,VITAMIN B-12,) 1000 MCG/ML injection Inject 1,000 mcg into the muscle every 30 (thirty) days.    [provider]  insulin degludec (TRESIBA FLEXTOUCH) 100 UNIT/ML FlexTouch Pen Inject 20 Units into the skin daily. 02/05/18   [provider]  levothyroxine (SYNTHROID, LEVOTHROID) 112 MCG tablet Take 112 mcg by mouth daily before breakfast.  12/18/17   [provider]  mometasone (NASONEX) 50 MCG/ACT nasal spray Place 1 spray into the nose daily as needed for congestion. 07/23/08   [provider]  nitroGLYCERIN (NITROSTAT) 0.4 MG SL tablet Place 1 tablet (0.4 mg total) under the tongue every 5 (five) minutes as needed for chest pain (use SL prn angina, repeat in 5 minutes if needed). 03/28/18   Baldo Daub, MD  NON FORMULARY VITAL REDS-1 scoop daily    [provider]  rosuvastatin (CRESTOR) 5 MG tablet Take 5 mg by mouth daily.    [provider]  Semaglutide,0.25 or 0.5MG /DOS, (OZEMPIC, 0.25 OR 0.5 MG/DOSE,) 2 MG/1.5ML SOPN Inject 0.25 mg into the skin once a week. 08/10/20   [provider]  tamsulosin (FLOMAX) 0.4 MG CAPS capsule Take 0.4 mg by mouth daily.  01/01/18   [provider]  Zinc 100 MG TABS Take 100 mg by mouth daily. 05/09/20   [provider]    Allergies    Atorvastatin, Lisinopril, Ranexa [ranolazine], Zocor [simvastatin], and Sulfa antibiotics  Review of Systems   Review of Systems  Cardiovascular:  Positive for chest pain.  All other systems reviewed and are negative except that which was mentioned in HPI  Physical Exam Updated Vital Signs BP 120/87   Pulse (!) 53   Temp 98.3 F (36.8 C) (Oral)   Resp 17   SpO2 98%   Physical Exam Constitutional:      General: He is not in acute distress.    Appearance: Normal appearance.  HENT:     Head: Normocephalic and atraumatic.  Eyes:     Conjunctiva/sclera: Conjunctivae normal.  Cardiovascular:     Rate and Rhythm: Normal rate and regular rhythm.     Heart sounds: Normal heart sounds. No murmur heard. Pulmonary:     Effort: Pulmonary effort is normal.     Breath sounds: Normal breath sounds.  Abdominal:     General: Abdomen is flat. Bowel sounds are normal. There is no distension.     Palpations: Abdomen is soft.     Tenderness: There is no abdominal tenderness.  Musculoskeletal:     Right lower leg: No edema.     Left lower leg: No edema.  Skin:    General: Skin is warm and dry.  Neurological:     Mental Status: He is alert and oriented to person, place, and time.     Comments: fluent  Psychiatric:        Mood and Affect: Mood normal.        Behavior: Behavior normal.    ED Results / Procedures / Treatments   Labs (all labs ordered are listed, but only abnormal results are displayed) Labs Reviewed  BASIC METABOLIC PANEL - Abnormal; Notable for the following components:      Result Value   Glucose, Bld 130 (*)    BUN 24 (*)    Creatinine, Ser 1.32 (*)    GFR, Estimated 55 (*)    Anion gap 4 (*)    All other components within normal limits  CBC - Abnormal; Notable for the following components:   Platelets 148 (*)    All other  components within normal limits  CBG MONITORING, ED - Abnormal; Notable for the following components:   Glucose-Capillary 117 (*)    All other components within normal limits  CBG MONITORING, ED - Abnormal; Notable for the following components:   Glucose-Capillary 101 (*)    All other components within normal limits  RESP PANEL BY RT-PCR (FLU A&B, COVID) [id number]  TROPONIN I (HIGH SENSITIVITY)  TROPONIN I (HIGH SENSITIVITY)    EKG EKG  Interpretation  Date/Time:  Thursday August 11 2020 12:33:20 EDT Ventricular Rate:  53 PR Interval:  166 QRS Duration: 134 QT Interval:  450 QTC Calculation: 422 R Axis:   41 Text Interpretation: Sinus bradycardia Right bundle branch block Abnormal ECG No previous ECGs available Confirmed by Frederick Peers [phone]) on 08/11/2020 4:23:23 PM  Radiology DG Chest 2 View  Result Date: 08/11/2020 CLINICAL DATA:  Chest pain and shortness of breath over the last week. EXAM: CHEST - 2 VIEW COMPARISON:  06/03/2017 FINDINGS: Heart size is normal. Chronic aortic atherosclerosis. Mild chronic markings at the lung bases are unchanged. No sign of active infiltrate, mass, effusion or collapse. No edema. IMPRESSION: No active disease. Aortic atherosclerosis. Mild chronic markings at the lung bases. Electronically Signed   By: Paulina Fusi M.D.   On: 08/11/2020 13:33    Procedures Procedures   Medications Ordered in ED Medications  sodium chloride 0.9 % bolus 1,000 mL (1,000 mLs Intravenous New Bag/Given 08/11/20 1622)    ED Course  I have reviewed the triage vital signs and the nursing notes.  Pertinent labs & imaging results that were available during my care of the patient were reviewed by me and considered in my medical decision making (see chart for details).    MDM Rules/Calculators/A&P                          Well- appearing and comfortable on exam, reassuring vital signs.  Denying any chest pain currently.  EKG shows a mild sinus bradycardia with right bundle  branch block, no previous available for comparison.  No acute ischemic changes.  Symptoms are highly concerning for unstable angina.  I have consulted cardiology and discussed w/ Trish, cards coordinator. PT will be evaluated by APP Bjorn Loser and admitted to cardiology service for likely cath in the morning. I have ordered IVF bolus for very mild elevation in creatinine of 1.3. Initial trop negative and CXR clear. Final Clinical Impression(s) / ED Diagnoses Final diagnoses:  Unstable angina [facility])    Rx / DC Orders ED Discharge Orders     None        Ruchi Stoney, Ambrose Finland, MD 08/11/20 1625

## 2020-08-11 NOTE — ED Provider Notes (Signed)
Emergency Medicine Provider Triage Evaluation Note  Craig Armstrong , a 80 y.o. male  was evaluated in triage.  Pt complains of chest pain.  Has been intermittent for the past few months but in the the past few days it has gotten worse.  Sent by cardiology for concern for unstable angina.  He was given aspirin by EMS.  States that he does not have pain when he is at rest but is more so when he exerts himself.  Denies any shortness of breath, fever or leg swelling.  Review of Systems  Positive: Chest pain Negative: Shortness of breath, leg swelling  Physical Exam  BP 119/86   Pulse (!) 49   Temp 98.3 F (36.8 C) (Oral)   Resp 16   SpO2 99%  Gen:   Awake, no distress   Resp:  Normal effort  MSK:   Moves extremities without difficulty  Other:  No lower extremity edema  Medical Decision Making  Medically screening exam initiated at 12:42 PM.  Appropriate orders placed.  Craig Armstrong was informed that the remainder of the evaluation will be completed by another provider, this initial triage assessment does not replace that evaluation, and the importance of remaining in the ED until their evaluation is complete.  Briefly reviewed cardiology note and concern for unstable angina.  Will begin work-up here, inform triage nurse and charge nurse that patient will need room soon as possible.   Dietrich Pates, PA-C 08/11/20 1243    Mancel Bale, MD 08/13/20 1359

## 2020-08-11 NOTE — Patient Instructions (Signed)
Medication Instructions:  Your physician recommends that you continue on your current medications as directed. Please refer to the Current Medication list given to you today.  *If you need a refill on your cardiac medications before your next appointment, please call your pharmacy*   Lab Work: None If you have labs (blood work) drawn today and your tests are completely normal, you will receive your results only by: MyChart Message (if you have MyChart) OR A paper copy in the mail If you have any lab test that is abnormal or we need to change your treatment, we will call you to review the results.   Testing/Procedures: None   Follow-Up: At [facility], you and your health needs are our priority.  As part of our continuing mission to provide you with exceptional heart care, we have created designated Provider Care Teams.  These Care Teams include your primary Cardiologist (physician) and Advanced Practice Providers (APPs -  Physician Assistants and Nurse Practitioners) who all work together to provide you with the care you need, when you need it.  We recommend signing up for the patient portal called "MyChart".  Sign up information is provided on this After Visit Summary.  MyChart is used to connect with patients for Virtual Visits (Telemedicine).  Patients are able to view lab/test results, encounter notes, upcoming appointments, etc.  Non-urgent messages can be sent to your provider as well.   To learn more about what you can do with MyChart, go to [email or web address].    Your next appointment:   As recommended post-hospital visit  The format for your next appointment:   In Person  Provider:   Norman Herrlich, MD   Other Instructions

## 2020-08-11 NOTE — Progress Notes (Signed)
ANTICOAGULATION CONSULT NOTE - Initial Consult  Pharmacy Consult for heparin Indication: chest pain/ACS  Allergies  Allergen Reactions   Atorvastatin Other (See Comments)    Myalgia   Lisinopril     Hypotension   Ranexa [Ranolazine] Other (See Comments)    "extreme fatigue and low heart rate"   Zocor [Simvastatin] Other (See Comments)    Myalgias    Sulfa Antibiotics Other (See Comments)    CONSTIPATION    Patient Measurements:   Heparin Dosing Weight: TBW  Vital Signs: Temp: 98.3 F (36.8 C) (07/07 1237) Temp Source: Oral (07/07 1237) BP: 142/73 (07/07 1630) Pulse Rate: 55 (07/07 1630)  Labs: Recent Labs    08/11/20 1238 08/11/20 1449  HGB 14.4  --   HCT 43.1  --   PLT 148*  --   CREATININE 1.32*  --   TROPONINIHS 5 5    Estimated Creatinine Clearance: 54.7 mL/min (A) (by C-G formula based on SCr of 1.32 mg/dL (H)).   Medical History: Past Medical History:  Diagnosis Date   Chest pain 08/25/2015   Essential hypertension 02/27/2018   Hyperlipidemia 08/31/2015   Mild CAD 02/27/2018   Type 2 diabetes mellitus (HCC) 08/31/2015   Type 2 diabetes mellitus with insulin therapy (HCC)    Unstable angina (HCC) 08/31/2015    Assessment: 80 YOM presenting with CP, hx of CAD, he is not on anticoagulation PTA.  H/H wnl, plts 148, plan for cath  Goal of Therapy:  Heparin level 0.3-0.7 units/ml Monitor platelets by anticoagulation protocol: Yes   Plan:  Heparin 4000 units IV x 1, and gtt at 1200 units/hr F/u 8 hour heparin level F/u post-cath plan  Daylene Posey, PharmD Clinical Pharmacist ED Pharmacist Phone # [phone] 08/11/2020 5:29 PM

## 2020-08-11 NOTE — ED Notes (Signed)
Attempted to call report

## 2020-08-11 NOTE — ED Triage Notes (Signed)
Patient BIB EMS from home for 2/10 chest pain that relieved with 324mg  aspirin, did not received NTG PTA. Patient alert, oriented, and in no apparent distress at this time.

## 2020-08-12 ENCOUNTER — Encounter (HOSPITAL_COMMUNITY): Payer: Self-pay | Admitting: Cardiology

## 2020-08-12 ENCOUNTER — Observation Stay (HOSPITAL_BASED_OUTPATIENT_CLINIC_OR_DEPARTMENT_OTHER): Payer: Medicare Other

## 2020-08-12 ENCOUNTER — Encounter (HOSPITAL_COMMUNITY)
Admission: EM | Disposition: A | Discharge status: Home / Self Care | Payer: Self-pay | Source: Home / Self Care | Attending: Emergency Medicine

## 2020-08-12 DIAGNOSIS — I200 Unstable angina: Secondary | ICD-10-CM | POA: Diagnosis not present

## 2020-08-12 DIAGNOSIS — R079 Chest pain, unspecified: Secondary | ICD-10-CM

## 2020-08-12 DIAGNOSIS — E119 Type 2 diabetes mellitus without complications: Secondary | ICD-10-CM | POA: Diagnosis not present

## 2020-08-12 DIAGNOSIS — I10 Essential (primary) hypertension: Secondary | ICD-10-CM | POA: Diagnosis not present

## 2020-08-12 DIAGNOSIS — I25110 Atherosclerotic heart disease of native coronary artery with unstable angina pectoris: Secondary | ICD-10-CM | POA: Diagnosis not present

## 2020-08-12 DIAGNOSIS — U071 COVID-19: Secondary | ICD-10-CM | POA: Diagnosis not present

## 2020-08-12 HISTORY — PX: LEFT HEART CATH AND CORONARY ANGIOGRAPHY: CATH118249

## 2020-08-12 LAB — LIPID PANEL
Cholesterol: 170 mg/dL (ref 0–200)
HDL: 41 mg/dL (ref >40)
LDL Cholesterol: 118 mg/dL — ABNORMAL HIGH (ref 0–99)
Total CHOL/HDL Ratio: 4.1 RATIO
Triglycerides: 55 mg/dL (ref <150)
VLDL: 11 mg/dL (ref 0–40)

## 2020-08-12 LAB — ECHOCARDIOGRAM COMPLETE
AR max vel: 2.96 cm2
AV Area VTI: 3.13 cm2
AV Area mean vel: 3.02 cm2
AV Mean grad: 3.5 mmHg
AV Peak grad: 6.6 mmHg
Ao pk vel: 1.29 m/s
Area-P 1/2: 3.37 cm2
Calc EF: 61.9 %
Height: 72 in
P 1/2 time: 688 msec
S' Lateral: 2.8 cm
Single Plane A2C EF: 59.6 %
Single Plane A4C EF: 63.2 %
Weight: 3248 oz

## 2020-08-12 LAB — CBC
HCT: 38.1 % — ABNORMAL LOW (ref 39.0–52.0)
Hemoglobin: 13.1 g/dL (ref 13.0–17.0)
MCH: 30.5 pg (ref 26.0–34.0)
MCHC: 34.4 g/dL (ref 30.0–36.0)
MCV: 88.6 fL (ref 80.0–100.0)
Platelets: 126 10*3/uL — ABNORMAL LOW (ref 150–400)
RBC: 4.3 MIL/uL (ref 4.22–5.81)
RDW: 12.4 % (ref 11.5–15.5)
WBC: 5.2 10*3/uL (ref 4.0–10.5)
nRBC: 0 % (ref 0.0–0.2)

## 2020-08-12 LAB — COMPREHENSIVE METABOLIC PANEL
ALT: 15 U/L (ref 0–44)
AST: 17 U/L (ref 15–41)
Albumin: 3.2 g/dL — ABNORMAL LOW (ref 3.5–5.0)
Alkaline Phosphatase: 43 U/L (ref 38–126)
Anion gap: 7 (ref 5–15)
BUN: 19 mg/dL (ref 8–23)
CO2: 23 mmol/L (ref 22–32)
Calcium: 8.7 mg/dL — ABNORMAL LOW (ref 8.9–10.3)
Chloride: 108 mmol/L (ref 98–111)
Creatinine, Ser: 1.17 mg/dL (ref 0.61–1.24)
GFR, Estimated: >60 mL/min (ref >60)
Glucose, Bld: 100 mg/dL — ABNORMAL HIGH (ref 70–99)
Potassium: 4 mmol/L (ref 3.5–5.1)
Sodium: 138 mmol/L (ref 135–145)
Total Bilirubin: 0.9 mg/dL (ref 0.3–1.2)
Total Protein: 6.1 g/dL — ABNORMAL LOW (ref 6.5–8.1)

## 2020-08-12 LAB — TROPONIN I (HIGH SENSITIVITY)
Troponin I (High Sensitivity): 5 ng/L (ref <18)
Troponin I (High Sensitivity): 4 ng/L (ref <18)

## 2020-08-12 LAB — HEPARIN LEVEL (UNFRACTIONATED)
Heparin Unfractionated: 0.56 IU/mL (ref 0.30–0.70)
Heparin Unfractionated: 0.59 IU/mL (ref 0.30–0.70)

## 2020-08-12 LAB — GLUCOSE, CAPILLARY
Glucose-Capillary: 84 mg/dL (ref 70–99)
Glucose-Capillary: 72 mg/dL (ref 70–99)
Glucose-Capillary: 110 mg/dL — ABNORMAL HIGH (ref 70–99)
Glucose-Capillary: 139 mg/dL — ABNORMAL HIGH (ref 70–99)

## 2020-08-12 SURGERY — LEFT HEART CATH AND CORONARY ANGIOGRAPHY
Anesthesia: LOCAL

## 2020-08-12 MED ORDER — HEPARIN (PORCINE) IN NACL 1000-0.9 UT/500ML-% IV SOLN
INTRAVENOUS | Status: DC | PRN
  Administered 2020-08-12 (×2): 500 mL

## 2020-08-12 MED ORDER — SODIUM CHLORIDE 0.9% FLUSH: 3.0000 mL | INTRAVENOUS | Status: DC | PRN

## 2020-08-12 MED ORDER — FENTANYL CITRATE (PF) 100 MCG/2ML IJ SOLN
INTRAMUSCULAR | Status: DC | PRN
  Administered 2020-08-12: 25 ug via INTRAVENOUS

## 2020-08-12 MED ORDER — SODIUM CHLORIDE 0.9% FLUSH
3.0000 mL | Freq: Two times a day (BID) | INTRAVENOUS | Status: DC
  Administered 2020-08-13: 3 mL via INTRAVENOUS

## 2020-08-12 MED ORDER — ACETAMINOPHEN 325 MG PO TABS: 650.0000 mg | ORAL_TABLET | ORAL | Status: DC | PRN

## 2020-08-12 MED ORDER — HEPARIN SODIUM (PORCINE) 1000 UNIT/ML IJ SOLN
INTRAMUSCULAR | Status: DC | PRN
  Administered 2020-08-12: 4500 [IU] via INTRAVENOUS

## 2020-08-12 MED ORDER — NITROGLYCERIN 0.4 MG SL SUBL: 0.4000 mg | SUBLINGUAL_TABLET | SUBLINGUAL | Status: DC | PRN

## 2020-08-12 MED ORDER — MIDAZOLAM HCL 2 MG/2ML IJ SOLN
INTRAMUSCULAR | Status: DC | PRN
  Administered 2020-08-12: 2 mg via INTRAVENOUS

## 2020-08-12 MED ORDER — ONDANSETRON HCL 4 MG/2ML IJ SOLN: 4.0000 mg | Freq: Four times a day (QID) | INTRAMUSCULAR | Status: DC | PRN

## 2020-08-12 MED ORDER — SODIUM CHLORIDE 0.9 % IV SOLN: 250.0000 mL | INTRAVENOUS | Status: DC | PRN

## 2020-08-12 MED ORDER — IOHEXOL 350 MG/ML SOLN
INTRAVENOUS | Status: DC | PRN
  Administered 2020-08-12: 60 mL

## 2020-08-12 MED ORDER — SODIUM CHLORIDE 0.9 % IV SOLN: INTRAVENOUS | Status: AC

## 2020-08-12 MED ORDER — DEXTROSE 50 % IV SOLN
INTRAVENOUS | Status: AC
  Administered 2020-08-12: 50 mL
  Filled 2020-08-12: qty 50

## 2020-08-12 MED ORDER — VERAPAMIL HCL 2.5 MG/ML IV SOLN
INTRAVENOUS | Status: AC
  Filled 2020-08-12: qty 2

## 2020-08-12 MED ORDER — LABETALOL HCL 5 MG/ML IV SOLN: 10.0000 mg | INTRAVENOUS | Status: AC | PRN

## 2020-08-12 MED ORDER — LIDOCAINE HCL (PF) 1 % IJ SOLN
INTRAMUSCULAR | Status: DC | PRN
  Administered 2020-08-12: 2 mL

## 2020-08-12 MED ORDER — VERAPAMIL HCL 2.5 MG/ML IV SOLN
INTRAVENOUS | Status: DC | PRN
  Administered 2020-08-12: 10 mL via INTRA_ARTERIAL

## 2020-08-12 MED ORDER — MIDAZOLAM HCL 2 MG/2ML IJ SOLN
INTRAMUSCULAR | Status: AC
  Filled 2020-08-12: qty 2

## 2020-08-12 MED ORDER — HYDRALAZINE HCL 20 MG/ML IJ SOLN: 10.0000 mg | INTRAMUSCULAR | Status: AC | PRN

## 2020-08-12 MED ORDER — FENTANYL CITRATE (PF) 100 MCG/2ML IJ SOLN
INTRAMUSCULAR | Status: AC
  Filled 2020-08-12: qty 2

## 2020-08-12 SURGICAL SUPPLY — 9 items
CATH 5FR JL3.5 JR4 ANG PIG MP (CATHETERS) ×1 IMPLANT
DEVICE RAD COMP TR BAND LRG (VASCULAR PRODUCTS) ×1 IMPLANT
GLIDESHEATH SLEND SS 6F .021 (SHEATH) ×1 IMPLANT
KIT HEART LEFT (KITS) ×2 IMPLANT
PACK CARDIAC CATHETERIZATION (CUSTOM PROCEDURE TRAY) ×2 IMPLANT
SHEATH PROBE COVER 6X72 (BAG) ×1 IMPLANT
TRANSDUCER W/STOPCOCK (MISCELLANEOUS) ×2 IMPLANT
TUBING CIL FLEX 10 FLL-RA (TUBING) ×2 IMPLANT
WIRE HI TORQ VERSACORE J 260CM (WIRE) ×1 IMPLANT

## 2020-08-12 NOTE — Progress Notes (Addendum)
Progress Note  Patient Name: Craig Armstrong Date of Encounter: 08/12/2020  [facility] Cardiologist: Norman Herrlich, MD   Subjective   No acute overnight events. No recurrent chest pain. He states he has no problems at rest. No shortness of breath. No palpitations, lightheadedness, dizziness, or syncope.  Inpatient Medications    Scheduled Meds:  aspirin EC  81 mg Oral Daily   cholecalciferol  2,000 Units Oral Daily   fluticasone  1 spray Each Nare Daily   insulin aspart  0-15 Units Subcutaneous TID WC   insulin glargine  20 Units Subcutaneous Daily   levothyroxine  112 mcg Oral QAC breakfast   rosuvastatin  5 mg Oral Daily   sodium chloride flush  3 mL Intravenous Q12H   sodium chloride flush  3 mL Intravenous Q12H   tamsulosin  0.4 mg Oral Daily   Continuous Infusions:  sodium chloride     sodium chloride     sodium chloride     sodium chloride 1 mL/kg/hr (08/12/20 0548)   heparin 1,200 Units/hr (08/11/20 1807)   PRN Meds: sodium chloride, sodium chloride, acetaminophen, ALPRAZolam, nitroGLYCERIN, ondansetron (ZOFRAN) IV, sodium chloride flush, sodium chloride flush, zolpidem   Vital Signs    Vitals:   08/11/20 1945 08/11/20 2041 08/12/20 0029 08/12/20 0413  BP: 113/74 132/83 114/76 (!) 113/54  Pulse: (!) 53 61 (!) 56 70  Resp: 18 14 16 17   Temp:  98.3 F (36.8 C) 98.6 F (37 C) 98.6 F (37 C)  TempSrc:  Oral Oral Oral  SpO2: 96% 96% 96% 92%  Weight:  92.1 kg      Intake/Output Summary (Last 24 hours) at 08/12/2020 0845 Last data filed at 08/12/2020 0600 Gross per 24 hour  Intake 1487.84 ml  Output --  Net 1487.84 ml   Last 3 Weights 08/11/2020 08/11/2020 04/10/2018  Weight (lbs) 203 lb 206 lb 201 lb  Weight (kg) 92.08 kg 93.441 kg 91.173 kg      Telemetry    Sinus rhythm with rates in the 40s to 50s. - Personally Reviewed  ECG    No new ECG tracing today. - Personally Reviewed  Physical Exam   GEN: No acute distress.   Neck: No JVD. Cardiac:  Bradycardic with normal rhythm. No murmurs, rubs, or gallops.  Respiratory: Clear to auscultation bilaterally. GI: Soft, non-distended, and non-tender.  MS: No lower extremity edema. No deformity. Skin: Warm and dry. Neuro:  No focal deficits. Psych: Normal affect. Responds appropriately.  Labs    High Sensitivity Troponin:   Recent Labs  Lab 08/11/20 1238 08/11/20 1449 08/11/20 2041 08/12/20 0202 08/12/20 0624  TROPONINIHS 5 5 5 5 4       Chemistry Recent Labs  Lab 08/11/20 1238 08/12/20 0148  NA 137 138  K 4.9 4.0  CL 107 108  CO2 26 23  GLUCOSE 130* 100*  BUN 24* 19  CREATININE 1.32* 1.17  CALCIUM 9.3 8.7*  PROT  --  6.1*  ALBUMIN  --  3.2*  AST  --  17  ALT  --  15  ALKPHOS  --  43  BILITOT  --  0.9  GFRNONAA 55* >60  ANIONGAP 4* 7     Hematology Recent Labs  Lab 08/11/20 1238 08/12/20 0148  WBC 6.1 5.2  RBC 4.76 4.30  HGB 14.4 13.1  HCT 43.1 38.1*  MCV 90.5 88.6  MCH 30.3 30.5  MCHC 33.4 34.4  RDW 12.3 12.4  PLT 148* 126*  BNPNo results for input(s): BNP, PROBNP in the last 168 hours.   DDimer No results for input(s): DDIMER in the last 168 hours.   Radiology    DG Chest 2 View  Result Date: 08/11/2020 CLINICAL DATA:  Chest pain and shortness of breath over the last week. EXAM: CHEST - 2 VIEW COMPARISON:  06/03/2017 FINDINGS: Heart size is normal. Chronic aortic atherosclerosis. Mild chronic markings at the lung bases are unchanged. No sign of active infiltrate, mass, effusion or collapse. No edema. IMPRESSION: No active disease. Aortic atherosclerosis. Mild chronic markings at the lung bases. Electronically Signed   By: Paulina Fusi M.D.   On: 08/11/2020 13:33    Cardiac Studies   Echo and cardiac catheterization pending.  Patient Profile     80 y.o. 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  Assessment & Plan    Unstable Angina Non-Obstructive CAD - Patient has a history of nonobstructive CAD and presented with chest pain concerning for unstable angina.  - EKG showed no acute ischemic changes.  - High-sensitivity troponin negative x5.  - Echo pending. - Currently chest pain free. - Continue IV Heparin. - Continue aspirin and statin. No beta-blocker given baseline bradycardia. - Plan is for cardiac catheterization today.   Bradycardia - Initial EKG in office yesterday showed ventricular rhythm with retrograde P waves at a rate of 69 bpm.  - Currently in sinus bradycardia with rates in the 40s to 50s today. - Not on any AV nodal agents.  - Dr. 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  - Continue to monitor on telemetry.  Hypertension - BP well controlled. - Not on any medications at this time.  Hyperlipidemia - Lipid panel this admission: Total Cholesterol 170, Triglycerides 55, HDL 41, LDL 118. - LDL goal <70 given CAD. - He has been intolerant to statins in the past due to myalgias and "brain fog." Currently takes Crestor 5mg  three times per weeks. Discussed that LDL not at all and discussed different options including increasing Crestor, adding Zetia, and referral to lipid clinic for consideration of PCSK9 inhibitor. Patient would like to try increasing Crestor to daily use. We can do this. Suspect patient will need to be referred to the lipid clinic at some point though.  Type 2 Diabetes Mellitus  - Hemoglobin A1c 8.0.  - Continue sliding scale insulin this admission. - Can restart home regimen at discharge. - Will need to follow-up with PCP.  CKD Stage III - Creatinine 1.32 on admission and 1.17 today.  COVID-19 - Patient had mild URI symptoms last week (onset 08/02/2020). Positive for COVID on admission. - Symptoms have basically  completely resolved. - Today will complete 10 days since symptom onset.  For questions or updates, please contact [facility] Please consult [email or web address] for contact info under     Signed, 08/04/2020, PA-C  08/12/2020, 8:45 AM   As above, patient seen and examined.  He denies chest pain or dyspnea this morning.  Telemetry shows sinus to sinus bradycardia.  Enzymes are negative.  Plan is for cardiac catheterization today.  The risk and benefits including myocardial infarction, CVA and death discussed and he agrees to proceed.  Continue aspirin, heparin and statin.  No beta-blocker in the setting  of bradycardia.  Note his previous electrocardiogram showing ventricular escape rhythm with retrograde P waves was reviewed by Dr. Lalla Brothers.  Given that he was not significantly bradycardic or symptomatic no intervention indicated.  We will arrange outpatient monitor at time of discharge to further assess.  Also note patient has tested positive for COVID-19 but states his symptoms were 9 days ago.  He is presently asymptomatic.  Olga Millers, MD

## 2020-08-12 NOTE — Progress Notes (Signed)
*  PRELIMINARY RESULTS* Echocardiogram 2D Echocardiogram has been performed.  Neomia Dear RDCS 08/12/2020, 1:33 PM

## 2020-08-12 NOTE — H&P (View-Only) (Signed)
Progress Note  Patient Name: Craig Armstrong Date of Encounter: 08/12/2020  [facility] Cardiologist: Norman Herrlich, MD   Subjective   No acute overnight events. No recurrent chest pain. He states he has no problems at rest. No shortness of breath. No palpitations, lightheadedness, dizziness, or syncope.  Inpatient Medications    Scheduled Meds:  aspirin EC  81 mg Oral Daily   cholecalciferol  2,000 Units Oral Daily   fluticasone  1 spray Each Nare Daily   insulin aspart  0-15 Units Subcutaneous TID WC   insulin glargine  20 Units Subcutaneous Daily   levothyroxine  112 mcg Oral QAC breakfast   rosuvastatin  5 mg Oral Daily   sodium chloride flush  3 mL Intravenous Q12H   sodium chloride flush  3 mL Intravenous Q12H   tamsulosin  0.4 mg Oral Daily   Continuous Infusions:  sodium chloride     sodium chloride     sodium chloride     sodium chloride 1 mL/kg/hr (08/12/20 0548)   heparin 1,200 Units/hr (08/11/20 1807)   PRN Meds: sodium chloride, sodium chloride, acetaminophen, ALPRAZolam, nitroGLYCERIN, ondansetron (ZOFRAN) IV, sodium chloride flush, sodium chloride flush, zolpidem   Vital Signs    Vitals:   08/11/20 1945 08/11/20 2041 08/12/20 0029 08/12/20 0413  BP: 113/74 132/83 114/76 (!) 113/54  Pulse: (!) 53 61 (!) 56 70  Resp: 18 14 16 17   Temp:  98.3 F (36.8 C) 98.6 F (37 C) 98.6 F (37 C)  TempSrc:  Oral Oral Oral  SpO2: 96% 96% 96% 92%  Weight:  92.1 kg      Intake/Output Summary (Last 24 hours) at 08/12/2020 0845 Last data filed at 08/12/2020 0600 Gross per 24 hour  Intake 1487.84 ml  Output --  Net 1487.84 ml   Last 3 Weights 08/11/2020 08/11/2020 04/10/2018  Weight (lbs) 203 lb 206 lb 201 lb  Weight (kg) 92.08 kg 93.441 kg 91.173 kg      Telemetry    Sinus rhythm with rates in the 40s to 50s. - Personally Reviewed  ECG    No new ECG tracing today. - Personally Reviewed  Physical Exam   GEN: No acute distress.   Neck: No JVD. Cardiac:  Bradycardic with normal rhythm. No murmurs, rubs, or gallops.  Respiratory: Clear to auscultation bilaterally. GI: Soft, non-distended, and non-tender.  MS: No lower extremity edema. No deformity. Skin: Warm and dry. Neuro:  No focal deficits. Psych: Normal affect. Responds appropriately.  Labs    High Sensitivity Troponin:   Recent Labs  Lab 08/11/20 1238 08/11/20 1449 08/11/20 2041 08/12/20 0202 08/12/20 0624  TROPONINIHS 5 5 5 5 4       Chemistry Recent Labs  Lab 08/11/20 1238 08/12/20 0148  NA 137 138  K 4.9 4.0  CL 107 108  CO2 26 23  GLUCOSE 130* 100*  BUN 24* 19  CREATININE 1.32* 1.17  CALCIUM 9.3 8.7*  PROT  --  6.1*  ALBUMIN  --  3.2*  AST  --  17  ALT  --  15  ALKPHOS  --  43  BILITOT  --  0.9  GFRNONAA 55* >60  ANIONGAP 4* 7     Hematology Recent Labs  Lab 08/11/20 1238 08/12/20 0148  WBC 6.1 5.2  RBC 4.76 4.30  HGB 14.4 13.1  HCT 43.1 38.1*  MCV 90.5 88.6  MCH 30.3 30.5  MCHC 33.4 34.4  RDW 12.3 12.4  PLT 148* 126*  BNPNo results for input(s): BNP, PROBNP in the last 168 hours.   DDimer No results for input(s): DDIMER in the last 168 hours.   Radiology    DG Chest 2 View  Result Date: 08/11/2020 CLINICAL DATA:  Chest pain and shortness of breath over the last week. EXAM: CHEST - 2 VIEW COMPARISON:  06/03/2017 FINDINGS: Heart size is normal. Chronic aortic atherosclerosis. Mild chronic markings at the lung bases are unchanged. No sign of active infiltrate, mass, effusion or collapse. No edema. IMPRESSION: No active disease. Aortic atherosclerosis. Mild chronic markings at the lung bases. Electronically Signed   By: Paulina Fusi M.D.   On: 08/11/2020 13:33    Cardiac Studies   Echo and cardiac catheterization pending.  Patient Profile     80 y.o. 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  Assessment & Plan    Unstable Angina Non-Obstructive CAD - Patient has a history of nonobstructive CAD and presented with chest pain concerning for unstable angina.  - EKG showed no acute ischemic changes.  - High-sensitivity troponin negative x5.  - Echo pending. - Currently chest pain free. - Continue IV Heparin. - Continue aspirin and statin. No beta-blocker given baseline bradycardia. - Plan is for cardiac catheterization today.   Bradycardia - Initial EKG in office yesterday showed ventricular rhythm with retrograde P waves at a rate of 69 bpm.  - Currently in sinus bradycardia with rates in the 40s to 50s today. - Not on any AV nodal agents.  - Dr. 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  - Continue to monitor on telemetry.  Hypertension - BP well controlled. - Not on any medications at this time.  Hyperlipidemia - Lipid panel this admission: Total Cholesterol 170, Triglycerides 55, HDL 41, LDL 118. - LDL goal <70 given CAD. - He has been intolerant to statins in the past due to myalgias and "brain fog." Currently takes Crestor 5mg  three times per weeks. Discussed that LDL not at all and discussed different options including increasing Crestor, adding Zetia, and referral to lipid clinic for consideration of PCSK9 inhibitor. Patient would like to try increasing Crestor to daily use. We can do this. Suspect patient will need to be referred to the lipid clinic at some point though.  Type 2 Diabetes Mellitus  - Hemoglobin A1c 8.0.  - Continue sliding scale insulin this admission. - Can restart home regimen at discharge. - Will need to follow-up with PCP.  CKD Stage III - Creatinine 1.32 on admission and 1.17 today.  COVID-19 - Patient had mild URI symptoms last week (onset 08/02/2020). Positive for COVID on admission. - Symptoms have basically  completely resolved. - Today will complete 10 days since symptom onset.  For questions or updates, please contact [facility] Please consult [email or web address] for contact info under     Signed, 08/04/2020, PA-C  08/12/2020, 8:45 AM   As above, patient seen and examined.  He denies chest pain or dyspnea this morning.  Telemetry shows sinus to sinus bradycardia.  Enzymes are negative.  Plan is for cardiac catheterization today.  The risk and benefits including myocardial infarction, CVA and death discussed and he agrees to proceed.  Continue aspirin, heparin and statin.  No beta-blocker in the setting  of bradycardia.  Note his previous electrocardiogram showing ventricular escape rhythm with retrograde P waves was reviewed by Dr. Lalla Brothers.  Given that he was not significantly bradycardic or symptomatic no intervention indicated.  We will arrange outpatient monitor at time of discharge to further assess.  Also note patient has tested positive for COVID-19 but states his symptoms were 9 days ago.  He is presently asymptomatic.  Olga Millers, MD

## 2020-08-12 NOTE — Progress Notes (Signed)
ANTICOAGULATION CONSULT NOTE - Follow Up Consult  Pharmacy Consult for heparin Indication: chest pain/ACS  Allergies  Allergen Reactions   Atorvastatin Other (See Comments)    Myalgia   Lisinopril     Hypotension   Ranexa [Ranolazine] Other (See Comments)    "extreme fatigue and low heart rate"   Zocor [Simvastatin] Other (See Comments)    Myalgias    Sulfa Antibiotics Other (See Comments)    CONSTIPATION    Patient Measurements: Weight: 92.1 kg (203 lb) Heparin Dosing Weight: TBW  Vital Signs: Temp: 98.6 F (37 C) (07/08 0029) Temp Source: Oral (07/08 0029) BP: 114/76 (07/08 0029) Pulse Rate: 56 (07/08 0029)  Labs: Recent Labs    08/11/20 1238 08/11/20 1449 08/11/20 2041 08/12/20 0148  HGB 14.4  --   --  13.1  HCT 43.1  --   --  38.1*  PLT 148*  --   --  126*  HEPARINUNFRC  --   --   --  0.56  CREATININE 1.32*  --   --   --   TROPONINIHS 5 5 5   --      Estimated Creatinine Clearance: 50.6 mL/min (A) (by C-G formula based on SCr of 1.32 mg/dL (H)).   Medical History: Past Medical History:  Diagnosis Date   Chest pain 08/25/2015   Essential hypertension 02/27/2018   Hyperlipidemia 08/31/2015   Mild CAD 02/27/2018   Type 2 diabetes mellitus (HCC) 08/31/2015   Type 2 diabetes mellitus with insulin therapy (HCC)    Unstable angina (HCC) 08/31/2015    Assessment: 80 YOM presenting with CP, hx of CAD, he is not on anticoagulation PTA. Trop 5. Cardiology plans cath tomorrow. Heparin level of 0.56 is therapeutic on heparin 1200 units/hr.   Goal of Therapy:  Heparin level 0.3-0.7 units/ml Monitor platelets by anticoagulation protocol: Yes   Plan:  Continue heparin 1200 units/hr  Check 6 hr confirmatory heparin level  Follow plans for cath tomorrow    09/02/2015, PharmD Clinical Pharmacist  08/12/2020 2:40 AM

## 2020-08-12 NOTE — Progress Notes (Signed)
ANTICOAGULATION CONSULT NOTE - Follow Up Consult  Pharmacy Consult for heparin Indication: chest pain/ACS  Allergies  Allergen Reactions   Atorvastatin Other (See Comments)    Myalgia   Lisinopril     Hypotension   Ranexa [Ranolazine] Other (See Comments)    "extreme fatigue and low heart rate"   Zocor [Simvastatin] Other (See Comments)    Myalgias    Sulfa Antibiotics Other (See Comments)    CONSTIPATION    Patient Measurements: Weight: 92.1 kg (203 lb) Heparin Dosing Weight: TBW  Vital Signs: Temp: 97.8 F (36.6 C) (07/08 0853) Temp Source: Oral (07/08 0853) BP: 133/75 (07/08 0853) Pulse Rate: 53 (07/08 0853)  Labs: Recent Labs    08/11/20 1238 08/11/20 1449 08/11/20 2041 08/12/20 0148 08/12/20 0202 08/12/20 0624 08/12/20 0802  HGB 14.4  --   --  13.1  --   --   --   HCT 43.1  --   --  38.1*  --   --   --   PLT 148*  --   --  126*  --   --   --   HEPARINUNFRC  --   --   --  0.56  --   --  0.59  CREATININE 1.32*  --   --  1.17  --   --   --   TROPONINIHS 5   < > 5  --  5 4  --    < > = values in this interval not displayed.     Estimated Creatinine Clearance: 57.1 mL/min (by C-G formula based on SCr of 1.17 mg/dL).   Medical History: Past Medical History:  Diagnosis Date   Chest pain 08/25/2015   Essential hypertension 02/27/2018   Hyperlipidemia 08/31/2015   Mild CAD 02/27/2018   Type 2 diabetes mellitus (HCC) 08/31/2015   Type 2 diabetes mellitus with insulin therapy (HCC)    Unstable angina (HCC) 08/31/2015    Assessment: 80 YOM presenting with CP, hx of CAD, he is not on anticoagulation PTA. Trop 5. Cardiology plans cath tomorrow. Heparin level this am is therapeutic (0.59) on heparin 1200 units/hr. Plan for cath today.   Goal of Therapy:  Heparin level 0.3-0.7 units/ml Monitor platelets by anticoagulation protocol: Yes   Plan:  Continue heparin 1200 units/hr  F/up after cath  Sheppard Coil PharmD., BCPS Clinical Pharmacist 08/12/2020  12:35 PM

## 2020-08-12 NOTE — Interval H&P Note (Signed)
Cath Lab Visit (complete for each Cath Lab visit)  Clinical Evaluation Leading to the Procedure:   ACS: Yes.    Non-ACS:    Anginal Classification: CCS IV  Anti-ischemic medical therapy: Minimal Therapy (1 class of medications)  Non-Invasive Test Results: No non-invasive testing performed  Prior CABG: No previous CABG      History and Physical Interval Note:  08/12/2020 2:54 PM  Craig Armstrong  has presented today for surgery, with the diagnosis of unstable angina.  The various methods of treatment have been discussed with the patient and family. After consideration of risks, benefits and other options for treatment, the patient has consented to  Procedure(s): LEFT HEART CATH AND CORONARY ANGIOGRAPHY (N/A) as a surgical intervention.  The patient's history has been reviewed, patient examined, no change in status, stable for surgery.  I have reviewed the patient's chart and labs.  Questions were answered to the patient's satisfaction.     Lance Muss

## 2020-08-13 ENCOUNTER — Other Ambulatory Visit: Payer: Self-pay | Admitting: Cardiology

## 2020-08-13 ENCOUNTER — Encounter (HOSPITAL_COMMUNITY): Payer: Self-pay | Admitting: Cardiology

## 2020-08-13 DIAGNOSIS — R001 Bradycardia, unspecified: Secondary | ICD-10-CM

## 2020-08-13 DIAGNOSIS — I498 Other specified cardiac arrhythmias: Secondary | ICD-10-CM

## 2020-08-13 DIAGNOSIS — E7800 Pure hypercholesterolemia, unspecified: Secondary | ICD-10-CM

## 2020-08-13 DIAGNOSIS — I4510 Unspecified right bundle-branch block: Secondary | ICD-10-CM | POA: Diagnosis present

## 2020-08-13 DIAGNOSIS — U071 COVID-19: Secondary | ICD-10-CM

## 2020-08-13 DIAGNOSIS — I200 Unstable angina: Secondary | ICD-10-CM | POA: Diagnosis not present

## 2020-08-13 HISTORY — DX: COVID-19: U07.1

## 2020-08-13 LAB — CBC
HCT: 37.2 % — ABNORMAL LOW (ref 39.0–52.0)
Hemoglobin: 12.5 g/dL — ABNORMAL LOW (ref 13.0–17.0)
MCH: 30 pg (ref 26.0–34.0)
MCHC: 33.6 g/dL (ref 30.0–36.0)
MCV: 89.4 fL (ref 80.0–100.0)
Platelets: 144 10*3/uL — ABNORMAL LOW (ref 150–400)
RBC: 4.16 MIL/uL — ABNORMAL LOW (ref 4.22–5.81)
RDW: 12.3 % (ref 11.5–15.5)
WBC: 5.4 10*3/uL (ref 4.0–10.5)
nRBC: 0 % (ref 0.0–0.2)

## 2020-08-13 LAB — GLUCOSE, CAPILLARY
Glucose-Capillary: 95 mg/dL (ref 70–99)
Glucose-Capillary: 121 mg/dL — ABNORMAL HIGH (ref 70–99)

## 2020-08-13 MED ORDER — ROSUVASTATIN CALCIUM 10 MG PO TABS: 10.0000 mg | ORAL_TABLET | Freq: Every day | ORAL | 6 refills | Status: AC

## 2020-08-13 MED ORDER — ROSUVASTATIN CALCIUM 5 MG PO TABS: 10.0000 mg | ORAL_TABLET | Freq: Every day | ORAL | Status: DC

## 2020-08-13 NOTE — Discharge Instructions (Signed)
Call [facility] at [phone] if any bleeding, swelling or drainage at cath site.  May shower, no tub baths for 48 hours for groin sticks. No lifting over 5 pounds for 3 days.  No Driving for 3 days   We will have you wear a monitor for  30 days the office will call you to arrange.  Heart healthy diabetic diet.

## 2020-08-13 NOTE — Progress Notes (Signed)
Progress Note  Patient Name: Craig Armstrong Date of Encounter: 08/13/2020  [facility] Cardiologist: Norman HerrlichBrian Munley, MD   Subjective   Feels great at rest. All his pre-admission symptoms were exertional. Coronary CT Angio and echo with minimal abnormalities.  Inpatient Medications    Scheduled Meds:  aspirin EC  81 mg Oral Daily   cholecalciferol  2,000 Units Oral Daily   fluticasone  1 spray Each Nare Daily   insulin aspart  0-15 Units Subcutaneous TID WC   insulin glargine  20 Units Subcutaneous Daily   levothyroxine  112 mcg Oral QAC breakfast   rosuvastatin  5 mg Oral Daily   sodium chloride flush  3 mL Intravenous Q12H   tamsulosin  0.4 mg Oral Daily   Continuous Infusions:  sodium chloride     PRN Meds: sodium chloride, acetaminophen, ALPRAZolam, nitroGLYCERIN, ondansetron (ZOFRAN) IV, sodium chloride flush, zolpidem   Vital Signs    Vitals:   08/13/20 0355 08/13/20 0419 08/13/20 0814 08/13/20 1159  BP:  121/87 126/85   Pulse: (!) 0 (!) 54 (!) 54   Resp: (!) 0 18 18   Temp:  98.2 F (36.8 C) 98.5 F (36.9 C) 97.6 F (36.4 C)  TempSrc:  Oral Oral Oral  SpO2: (!) 0% 98% 96%   Weight:      Height:        Intake/Output Summary (Last 24 hours) at 08/13/2020 1204 Last data filed at 08/12/2020 1700 Gross per 24 hour  Intake 1235.34 ml  Output --  Net 1235.34 ml   Last 3 Weights 08/12/2020 08/11/2020 08/11/2020  Weight (lbs) 203 lb 203 lb 206 lb  Weight (kg) 92.08 kg 92.08 kg 93.441 kg      Telemetry    Sinus brady (40-55 at rest), does increase to 80s w activity - Personally Reviewed  ECG    SBrady, RBBB - Personally Reviewed  Physical Exam  Appears well, eager to go GEN: No acute distress.   Neck: No JVD Cardiac: RRR, no murmurs, rubs, or gallops. Wide split S2 Respiratory: Clear to auscultation bilaterally. GI: Soft, nontender, non-distended  MS: No edema; No deformity. Neuro:  Nonfocal  Psych: Normal affect   Labs    High Sensitivity  Troponin:   Recent Labs  Lab 08/11/20 1238 08/11/20 1449 08/11/20 2041 08/12/20 0202 08/12/20 0624  TROPONINIHS 5 5 5 5 4       Chemistry Recent Labs  Lab 08/11/20 1238 08/12/20 0148  NA 137 138  K 4.9 4.0  CL 107 108  CO2 26 23  GLUCOSE 130* 100*  BUN 24* 19  CREATININE 1.32* 1.17  CALCIUM 9.3 8.7*  PROT  --  6.1*  ALBUMIN  --  3.2*  AST  --  17  ALT  --  15  ALKPHOS  --  43  BILITOT  --  0.9  GFRNONAA 55* >60  ANIONGAP 4* 7     Hematology Recent Labs  Lab 08/11/20 1238 08/12/20 0148 08/13/20 0107  WBC 6.1 5.2 5.4  RBC 4.76 4.30 4.16*  HGB 14.4 13.1 12.5*  HCT 43.1 38.1* 37.2*  MCV 90.5 88.6 89.4  MCH 30.3 30.5 30.0  MCHC 33.4 34.4 33.6  RDW 12.3 12.4 12.3  PLT 148* 126* 144*    BNPNo results for input(s): BNP, PROBNP in the last 168 hours.   DDimer No results for input(s): DDIMER in the last 168 hours.   Radiology    DG Chest 2 View  Result Date: 08/11/2020 CLINICAL  DATA:  Chest pain and shortness of breath over the last week. EXAM: CHEST - 2 VIEW COMPARISON:  06/03/2017 FINDINGS: Heart size is normal. Chronic aortic atherosclerosis. Mild chronic markings at the lung bases are unchanged. No sign of active infiltrate, mass, effusion or collapse. No edema. IMPRESSION: No active disease. Aortic atherosclerosis. Mild chronic markings at the lung bases. Electronically Signed   By: Paulina Fusi M.D.   On: 08/11/2020 13:33   CARDIAC CATHETERIZATION  Result Date: 08/12/2020  Mid LAD lesion is 25% stenosed.  The left ventricular systolic function is normal.  LV end diastolic pressure is normal.  The left ventricular ejection fraction is 55-65% by visual estimate.  There is no aortic valve stenosis.  Continue aggressive preventive therapy.   ECHOCARDIOGRAM COMPLETE  Result Date: 08/12/2020    ECHOCARDIOGRAM REPORT   Patient Name:   Craig Armstrong Date of Exam: 08/12/2020 Medical Rec #:  [id number]       Height:       72.0 in Accession #:    [id number]       Weight:       203.0 lb Date of Birth:  May 12, 1940       BSA:          2.144 m Patient Age:    80 years        BP:           113/54 mmHg Patient Gender: M               HR:           51 bpm. Exam Location:  Inpatient Procedure: 2D Echo, Cardiac Doppler and Color Doppler Indications:    Chest pain  History:        Patient has no prior history of Echocardiogram examinations. CAD                 and Angina, Signs/Symptoms:Chest Pain; Risk                 Factors:Hypertension, Dyslipidemia and Diabetes.  Sonographer:    Neomia Dear RDCS Referring Phys: 9 RHONDA G BARRETT IMPRESSIONS  1. Left ventricular ejection fraction, by estimation, is 60 to 65%. The left ventricle has normal function. The left ventricle has no regional wall motion abnormalities. Left ventricular diastolic parameters were normal.  2. Right ventricular systolic function is normal. The right ventricular size is normal. There is normal pulmonary artery systolic pressure. The estimated right ventricular systolic pressure is 28.2 mmHg.  3. The mitral valve is normal in structure. Trivial mitral valve regurgitation.  4. The aortic valve is tricuspid. Aortic valve regurgitation is trivial. No aortic stenosis is present.  5. The inferior vena cava is normal in size with greater than 50% respiratory variability, suggesting right atrial pressure of 3 mmHg. FINDINGS  Left Ventricle: Left ventricular ejection fraction, by estimation, is 60 to 65%. The left ventricle has normal function. The left ventricle has no regional wall motion abnormalities. The left ventricular internal cavity size was normal in size. There is  no left ventricular hypertrophy. Left ventricular diastolic parameters were normal. Right Ventricle: The right ventricular size is normal. No increase in right ventricular wall thickness. Right ventricular systolic function is normal. There is normal pulmonary artery systolic pressure. The tricuspid regurgitant velocity is 2.51 m/s, and  with  an assumed right atrial pressure of 3 mmHg, the estimated right ventricular systolic pressure is 28.2 mmHg. Left Atrium: Left atrial size was normal in size. Right  Atrium: Right atrial size was normal in size. Pericardium: Trivial pericardial effusion is present. Mitral Valve: The mitral valve is normal in structure. Trivial mitral valve regurgitation. Tricuspid Valve: The tricuspid valve is normal in structure. Tricuspid valve regurgitation is trivial. Aortic Valve: The aortic valve is tricuspid. Aortic valve regurgitation is trivial. Aortic regurgitation PHT measures 688 msec. No aortic stenosis is present. Aortic valve mean gradient measures 3.5 mmHg. Aortic valve peak gradient measures 6.6 mmHg. Aortic valve area, by VTI measures 3.13 cm. Pulmonic Valve: The pulmonic valve was grossly normal. Pulmonic valve regurgitation is trivial. Aorta: The aortic root and ascending aorta are structurally normal, with no evidence of dilitation. Venous: The inferior vena cava is normal in size with greater than 50% respiratory variability, suggesting right atrial pressure of 3 mmHg. IAS/Shunts: No atrial level shunt detected by color flow Doppler.  LEFT VENTRICLE PLAX 2D LVIDd:         4.90 cm     Diastology LVIDs:         2.80 cm     LV e' medial:    5.87 cm/s LV PW:         1.00 cm     LV E/e' medial:  11.9 LV IVS:        0.90 cm     LV e' lateral:   7.40 cm/s LVOT diam:     2.30 cm     LV E/e' lateral: 9.4 LV SV:         88 LV SV Index:   41 LVOT Area:     4.15 cm  LV Volumes (MOD) LV vol d, MOD A2C: 83.7 ml LV vol d, MOD A4C: 85.5 ml LV vol s, MOD A2C: 33.8 ml LV vol s, MOD A4C: 31.5 ml LV SV MOD A2C:     49.9 ml LV SV MOD A4C:     85.5 ml LV SV MOD BP:      53.1 ml RIGHT VENTRICLE RV Basal diam:  3.10 cm     PULMONARY VEINS RV Mid diam:    1.90 cm     A Reversal Duration: 81.00 msec RV S prime:     12.70 cm/s  A Reversal Velocity: 31.30 cm/s TAPSE (M-mode): 2.3 cm      Diastolic Velocity:  35.50 cm/s                              S/D Velocity:        1.40                             Systolic Velocity:   49.50 cm/s LEFT ATRIUM             Index       RIGHT ATRIUM           Index LA diam:        2.90 cm 1.35 cm/m  RA Area:     20.30 cm LA Vol (A2C):   42.6 ml 19.87 ml/m RA Volume:   64.70 ml  30.18 ml/m LA Vol (A4C):   56.6 ml 26.40 ml/m LA Biplane Vol: 49.9 ml 23.27 ml/m  AORTIC VALVE                   PULMONIC VALVE AV Area (Vmax):    2.96 cm    PV Vmax:  0.78 m/s AV Area (Vmean):   3.02 cm    PV Vmean:      51.400 cm/s AV Area (VTI):     3.13 cm    PV VTI:        0.156 m AV Vmax:           128.50 cm/s PV Peak grad:  2.4 mmHg AV Vmean:          85.100 cm/s PV Mean grad:  1.0 mmHg AV VTI:            0.280 m AV Peak Grad:      6.6 mmHg AV Mean Grad:      3.5 mmHg LVOT Vmax:         91.70 cm/s LVOT Vmean:        61.800 cm/s LVOT VTI:          0.211 m LVOT/AV VTI ratio: 0.75 AI PHT:            688 msec  AORTA Ao Root diam: 4.00 cm Ao Asc diam:  3.50 cm MITRAL VALVE               TRICUSPID VALVE MV Area (PHT): 3.37 cm    TR Peak grad:   25.2 mmHg MV Decel Time: 225 msec    TR Vmax:        251.00 cm/s MV E velocity: 69.80 cm/s MV A velocity: 61.00 cm/s  SHUNTS MV E/A ratio:  1.14        Systemic VTI:  0.21 m                            Systemic Diam: 2.30 cm Epifanio Lesches MD Electronically signed by Epifanio Lesches MD Signature Date/Time: 08/12/2020/5:29:14 PM    Final     Cardiac Studies   08/12/2020 Cardiac Cath Mid LAD lesion is 25% stenosed. The left ventricular systolic function is normal. LV end diastolic pressure is normal. The left ventricular ejection fraction is 55-65% by visual estimate. There is no aortic valve stenosis.   Continue aggressive preventive therapy    ECHOCARDIOGRAM COMPLETE 08/12/2020 1. Left ventricular ejection fraction, by estimation, is 60 to 65%. The left ventricle has normal function. The left ventricle has no regional wall motion abnormalities. Left ventricular  diastolic parameters were normal.   2. Right ventricular systolic function is normal. The right ventricular size is normal. There is normal pulmonary artery systolic pressure. The estimated right ventricular systolic pressure is 28.2 mmHg.   3. The mitral valve is normal in structure. Trivial mitral valve regurgitation.   4. The aortic valve is tricuspid. Aortic valve regurgitation is trivial. No aortic stenosis is present.   5. The inferior vena cava is normal in size with greater than 50% respiratory variability, suggesting right atrial pressure of 3 mmHg.   Patient Profile     80 y.o. 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    Assessment & Plan    Exertional CP and dyspnea: no evidence of a cardiac cause is identified. Consider pulmonary consultation (can be done as an outpatient). Sinus brady: he reports this is chronic and  appears to be asymptomatic. Consider outpatient evaluation for chronotropic incompetence (this could explain the dyspnea, but not the pain). He may eventually need a pacemaker, but this is by no means imminent. Has RBBB, but no evidence of higher grade AV block. Junctional rhythm noted on pre-admission ECG. Will f/u with Dr. Dulce Sellar. HLP: on rosuvastatin, but LDL 118 - may need dose increase.  For questions or updates, please contact [facility] Please consult [email or web address] for contact info under        Signed, Thurmon Fair, MD  08/13/2020, 12:04 PM

## 2020-08-13 NOTE — Discharge Summary (Addendum)
Discharge Summary    Patient ID: Craig Armstrong MRN: [id number]; DOB: Apr 23, 1940  Admit date: 08/11/2020 Discharge date: 08/13/2020  PCP:  Lise Auer, MD   [facility] Providers Cardiologist:  Norman Herrlich, MD        Discharge Diagnoses    Principal Problem:   Chest pain of uncertain etiology Active Problems:   CAD in native artery, per cath 08/12/20 25% in LAD only   Type 2 diabetes mellitus (HCC)   Essential hypertension   Hyperlipidemia   Junctional rhythm   RBBB, chronic    COVID-19    Diagnostic Studies/Procedures    08/12/20 cardiac cath  __ Mid LAD lesion is 25% stenosed. The left ventricular systolic function is normal. LV end diastolic pressure is normal. The left ventricular ejection fraction is 55-65% by visual estimate. There is no aortic valve stenosis.   Continue aggressive preventive therapy.___________  Diagnostic Dominance: Right      Echo 08/12/20 IMPRESSIONS     1. Left ventricular ejection fraction, by estimation, is 60 to 65%. The  left ventricle has normal function. The left ventricle has no regional  wall motion abnormalities. Left ventricular diastolic parameters were  normal.   2. Right ventricular systolic function is normal. The right ventricular  size is normal. There is normal pulmonary artery systolic pressure. The  estimated right ventricular systolic pressure is 28.2 mmHg.   3. The mitral valve is normal in structure. Trivial mitral valve  regurgitation.   4. The aortic valve is tricuspid. Aortic valve regurgitation is trivial.  No aortic stenosis is present.   5. The inferior vena cava is normal in size with greater than 50%  respiratory variability, suggesting right atrial pressure of 3 mmHg.   FINDINGS   Left Ventricle: Left ventricular ejection fraction, by estimation, is 60  to 65%. The left ventricle has normal function. The left ventricle has no  regional wall motion abnormalities. The left ventricular internal  cavity  size was normal in size. There is   no left ventricular hypertrophy. Left ventricular diastolic parameters  were normal.   Right Ventricle: The right ventricular size is normal. No increase in  right ventricular wall thickness. Right ventricular systolic function is  normal. There is normal pulmonary artery systolic pressure. The tricuspid  regurgitant velocity is 2.51 m/s, and   with an assumed right atrial pressure of 3 mmHg, the estimated right  ventricular systolic pressure is 28.2 mmHg.   Left Atrium: Left atrial size was normal in size.   Right Atrium: Right atrial size was normal in size.   Pericardium: Trivial pericardial effusion is present.   Mitral Valve: The mitral valve is normal in structure. Trivial mitral  valve regurgitation.   Tricuspid Valve: The tricuspid valve is normal in structure. Tricuspid  valve regurgitation is trivial.   Aortic Valve: The aortic valve is tricuspid. Aortic valve regurgitation is  trivial. Aortic regurgitation PHT measures 688 msec. No aortic stenosis is  present. Aortic valve mean gradient measures 3.5 mmHg. Aortic valve peak  gradient measures 6.6 mmHg.  Aortic valve area, by VTI measures 3.13 cm.   Pulmonic Valve: The pulmonic valve was grossly normal. Pulmonic valve  regurgitation is trivial.   Aorta: The aortic root and ascending aorta are structurally normal, with  no evidence of dilitation.   Venous: The inferior vena cava is normal in size with greater than 50%  respiratory variability, suggesting right atrial pressure of 3 mmHg.   IAS/Shunts: No atrial level shunt detected  by color flow Doppler.  History of Present Illness     Craig Armstrong is a 80 y.o. male with elevated Ca+ score, and moderate to severe mLAD and diag disease by cardiac CT 03/2018 with neg FFR non obs CAD at cath 2017, HTN, HLD, IDDM-2 RBBB and presented 08/11/20 with chest pain, that began about 4 months ago and increased with intensity of  exertion.  Central chest pain with radiation t shoulder to shoulder.  Occ down Lt arm.  Had occurred with just walking up 1 flight of steps. The episode lasted longer as well.  Associated symptoms of SOB.  He was seen by Dr. Dulce SellarMunley, had chest pain same AM and in Junctional rhythm on visit.  Then sent [facility] for further eval.    Na 137, K+ 4.9, Cr 1.32 hs troponin 5 hgb 14.4, WBC 6.1 plts 148  2V CXR No active disease. Aortic atherosclerosis. Mild chronic markings at the lung bases.  He was admitted and cardiac cath planned.  [facility] SR with old RBBB, junct resolved.  IV heparin was added.  SSI added for his DM.  He did have URI and was positive for COVID.      Hospital Course     Consultants: none   Hs Troponins were neg..Tchol  170, TG 55 LDL 118 HDL 41 A1C was 7.9 TSH 1.523  He underwent cardiac cath with very minimal disease, his previous electrocardiogram showing ventricular escape rhythm with retrograde P waves was reviewed by Dr. Lalla BrothersLambert.  Given that he was not significantly bradycardic or symptomatic no intervention indicated. But will need out pt monitor.  Also consider ETT to eval for chronotropic incompetence.   Today pt feels well.  No cardiac reason for chest pain.  May need pulmonary eval as outpt.  Will increase crestor.  Has been seen and found stable for discharge by Dr. Royann Shiversroitoru  at discharge he was 10 days out from symptoms of COVID.   Did the patient have an acute coronary syndrome (MI, NSTEMI, STEMI, etc) this admission?:  No                               Did the patient have a percutaneous coronary intervention (stent / angioplasty)?:  No.       _____________  Discharge Vitals Blood pressure 130/86, pulse (!) 52, temperature 97.6 F (36.4 C), temperature source Oral, resp. rate 16, height 6' (1.829 m), weight 92.1 kg, SpO2 100 %.  Filed Weights   08/11/20 2041 08/12/20 2112  Weight: 92.1 kg 92.1 kg    Labs & Radiologic Studies    CBC Recent Labs     08/12/20 0148 08/13/20 0107  WBC 5.2 5.4  HGB 13.1 12.5*  HCT 38.1* 37.2*  MCV 88.6 89.4  PLT 126* 144*   Basic Metabolic Panel Recent Labs    16/11/9605/07/22 1238 08/12/20 0148  NA 137 138  K 4.9 4.0  CL 107 108  CO2 26 23  GLUCOSE 130* 100*  BUN 24* 19  CREATININE 1.32* 1.17  CALCIUM 9.3 8.7*   Liver Function Tests Recent Labs    08/12/20 0148  AST 17  ALT 15  ALKPHOS 43  BILITOT 0.9  PROT 6.1*  ALBUMIN 3.2*   No results for input(s): LIPASE, AMYLASE in the last 72 hours. High Sensitivity Troponin:   Recent Labs  Lab 08/11/20 1238 08/11/20 1449 08/11/20 2041 08/12/20 0202 08/12/20 [id number]  TROPONINIHS  5 5 5 5 4     BNP Invalid input(s): POCBNP D-Dimer No results for input(s): DDIMER in the last 72 hours. Hemoglobin A1C Recent Labs    08/11/20 2041  HGBA1C 7.9*   Fasting Lipid Panel Recent Labs    08/12/20 0148  CHOL 170  HDL 41  LDLCALC 118*  TRIG 55  CHOLHDL 4.1   Thyroid Function Tests Recent Labs    08/11/20 1725  TSH 1.523   _____________  DG Chest 2 View  Result Date: 08/11/2020 CLINICAL DATA:  Chest pain and shortness of breath over the last week. EXAM: CHEST - 2 VIEW COMPARISON:  06/03/2017 FINDINGS: Heart size is normal. Chronic aortic atherosclerosis. Mild chronic markings at the lung bases are unchanged. No sign of active infiltrate, mass, effusion or collapse. No edema. IMPRESSION: No active disease. Aortic atherosclerosis. Mild chronic markings at the lung bases. Electronically Signed   By: 06/05/2017 M.D.   On: 08/11/2020 13:33   CARDIAC CATHETERIZATION  Result Date: 08/12/2020  Mid LAD lesion is 25% stenosed.  The left ventricular systolic function is normal.  LV end diastolic pressure is normal.  The left ventricular ejection fraction is 55-65% by visual estimate.  There is no aortic valve stenosis.  Continue aggressive preventive therapy.   ECHOCARDIOGRAM COMPLETE  Result Date: 08/12/2020    ECHOCARDIOGRAM REPORT   Patient  Name:   Craig Armstrong Date of Exam: 08/12/2020 Medical Rec #:  10/13/2020       Height:       72.0 in Accession #:    [id number]      Weight:       203.0 lb Date of Birth:  1940/11/01       BSA:          2.144 m Patient Age:    72 years        BP:           113/54 mmHg Patient Gender: M               HR:           51 bpm. Exam Location:  Inpatient Procedure: 2D Echo, Cardiac Doppler and Color Doppler Indications:    Chest pain  History:        Patient has no prior history of Echocardiogram examinations. CAD                 and Angina, Signs/Symptoms:Chest Pain; Risk                 Factors:Hypertension, Dyslipidemia and Diabetes.  Sonographer:    70 RDCS Referring Phys: 61 RHONDA G BARRETT IMPRESSIONS  1. Left ventricular ejection fraction, by estimation, is 60 to 65%. The left ventricle has normal function. The left ventricle has no regional wall motion abnormalities. Left ventricular diastolic parameters were normal.  2. Right ventricular systolic function is normal. The right ventricular size is normal. There is normal pulmonary artery systolic pressure. The estimated right ventricular systolic pressure is 28.2 mmHg.  3. The mitral valve is normal in structure. Trivial mitral valve regurgitation.  4. The aortic valve is tricuspid. Aortic valve regurgitation is trivial. No aortic stenosis is present.  5. The inferior vena cava is normal in size with greater than 50% respiratory variability, suggesting right atrial pressure of 3 mmHg. FINDINGS  Left Ventricle: Left ventricular ejection fraction, by estimation, is 60 to 65%. The left ventricle has normal function. The left ventricle has no regional wall motion  abnormalities. The left ventricular internal cavity size was normal in size. There is  no left ventricular hypertrophy. Left ventricular diastolic parameters were normal. Right Ventricle: The right ventricular size is normal. No increase in right ventricular wall thickness. Right ventricular  systolic function is normal. There is normal pulmonary artery systolic pressure. The tricuspid regurgitant velocity is 2.51 m/s, and  with an assumed right atrial pressure of 3 mmHg, the estimated right ventricular systolic pressure is 28.2 mmHg. Left Atrium: Left atrial size was normal in size. Right Atrium: Right atrial size was normal in size. Pericardium: Trivial pericardial effusion is present. Mitral Valve: The mitral valve is normal in structure. Trivial mitral valve regurgitation. Tricuspid Valve: The tricuspid valve is normal in structure. Tricuspid valve regurgitation is trivial. Aortic Valve: The aortic valve is tricuspid. Aortic valve regurgitation is trivial. Aortic regurgitation PHT measures 688 msec. No aortic stenosis is present. Aortic valve mean gradient measures 3.5 mmHg. Aortic valve peak gradient measures 6.6 mmHg. Aortic valve area, by VTI measures 3.13 cm. Pulmonic Valve: The pulmonic valve was grossly normal. Pulmonic valve regurgitation is trivial. Aorta: The aortic root and ascending aorta are structurally normal, with no evidence of dilitation. Venous: The inferior vena cava is normal in size with greater than 50% respiratory variability, suggesting right atrial pressure of 3 mmHg. IAS/Shunts: No atrial level shunt detected by color flow Doppler.  LEFT VENTRICLE PLAX 2D LVIDd:         4.90 cm     Diastology LVIDs:         2.80 cm     LV e' medial:    5.87 cm/s LV PW:         1.00 cm     LV E/e' medial:  11.9 LV IVS:        0.90 cm     LV e' lateral:   7.40 cm/s LVOT diam:     2.30 cm     LV E/e' lateral: 9.4 LV SV:         88 LV SV Index:   41 LVOT Area:     4.15 cm  LV Volumes (MOD) LV vol d, MOD A2C: 83.7 ml LV vol d, MOD A4C: 85.5 ml LV vol s, MOD A2C: 33.8 ml LV vol s, MOD A4C: 31.5 ml LV SV MOD A2C:     49.9 ml LV SV MOD A4C:     85.5 ml LV SV MOD BP:      53.1 ml RIGHT VENTRICLE RV Basal diam:  3.10 cm     PULMONARY VEINS RV Mid diam:    1.90 cm     A Reversal Duration: 81.00 msec  RV S prime:     12.70 cm/s  A Reversal Velocity: 31.30 cm/s TAPSE (M-mode): 2.3 cm      Diastolic Velocity:  35.50 cm/s                             S/D Velocity:        1.40                             Systolic Velocity:   49.50 cm/s LEFT ATRIUM             Index       RIGHT ATRIUM           Index LA diam:  2.90 cm 1.35 cm/m  RA Area:     20.30 cm LA Vol (A2C):   42.6 ml 19.87 ml/m RA Volume:   64.70 ml  30.18 ml/m LA Vol (A4C):   56.6 ml 26.40 ml/m LA Biplane Vol: 49.9 ml 23.27 ml/m  AORTIC VALVE                   PULMONIC VALVE AV Area (Vmax):    2.96 cm    PV Vmax:       0.78 m/s AV Area (Vmean):   3.02 cm    PV Vmean:      51.400 cm/s AV Area (VTI):     3.13 cm    PV VTI:        0.156 m AV Vmax:           128.50 cm/s PV Peak grad:  2.4 mmHg AV Vmean:          85.100 cm/s PV Mean grad:  1.0 mmHg AV VTI:            0.280 m AV Peak Grad:      6.6 mmHg AV Mean Grad:      3.5 mmHg LVOT Vmax:         91.70 cm/s LVOT Vmean:        61.800 cm/s LVOT VTI:          0.211 m LVOT/AV VTI ratio: 0.75 AI PHT:            688 msec  AORTA Ao Root diam: 4.00 cm Ao Asc diam:  3.50 cm MITRAL VALVE               TRICUSPID VALVE MV Area (PHT): 3.37 cm    TR Peak grad:   25.2 mmHg MV Decel Time: 225 msec    TR Vmax:        251.00 cm/s MV E velocity: 69.80 cm/s MV A velocity: 61.00 cm/s  SHUNTS MV E/A ratio:  1.14        Systemic VTI:  0.21 m                            Systemic Diam: 2.30 cm Epifanio Lesches MD Electronically signed by Epifanio Lesches MD Signature Date/Time: 08/12/2020/5:29:14 PM    Final    Disposition   Pt is being discharged home today in good condition.  Follow-up Plans & Appointments   Call [facility] at [phone] if any bleeding, swelling or drainage at cath site.  May shower, no tub baths for 48 hours for groin sticks. No lifting over 5 pounds for 3 days.  No Driving for 3 days   We will have you wear a monitor for  30 days the office will call you to  arrange.  Heart healthy diabetic diet.  Follow-up Information     Baldo Daub, MD Follow up.   Specialties: Cardiology, Radiology Why: the office should call you for appt, if you have not heard by [facility]. call the office Contact information: [address] [place] [address] [phone]                  Discharge Medications   Allergies as of 08/13/2020       Reactions   Atorvastatin Other (See Comments)   Myalgia   Lisinopril Other (See Comments)   Hypotension   Ranexa [ranolazine] Other (See Comments)   "extreme  fatigue and low heart rate"   Zocor [simvastatin] Other (See Comments)   Myalgias   Sulfa Antibiotics Other (See Comments)   CONSTIPATION        Medication List     STOP taking these medications    nitroGLYCERIN 0.4 MG SL tablet Commonly known as: NITROSTAT       TAKE these medications    aspirin EC 81 MG tablet Take 81 mg by mouth daily.   Coenzyme Q10 300 MG Caps Take 300 mg by mouth daily.   cyanocobalamin 1000 MCG/ML injection Commonly known as: (VITAMIN B-12) Inject 1,000 mcg into the muscle every 30 (thirty) days.   levothyroxine 112 MCG tablet Commonly known as: SYNTHROID Take 112 mcg by mouth daily before breakfast.   mometasone 50 MCG/ACT nasal spray Commonly known as: NASONEX Place 1 spray into the nose daily as needed for congestion.   NON FORMULARY Take 1 Scoop by mouth every other day. VITAL REDS   Ozempic (0.25 or 0.5 MG/DOSE) 2 MG/1.5ML Sopn Generic drug: Semaglutide(0.25 or 0.5MG /DOS) Inject 0.25 mg into the skin once a week.   rosuvastatin 10 MG tablet Commonly known as: CRESTOR Take 1 tablet (10 mg total) by mouth daily. Start taking on: August 14, 2020 What changed:  medication strength how much to take   tamsulosin 0.4 MG Caps capsule Commonly known as: FLOMAX Take 0.4 mg by mouth daily.   Evaristo Bury FlexTouch 100 UNIT/ML FlexTouch Pen Generic drug: insulin degludec Inject 20 Units into the skin  daily.   VITAMIN C ER PO Take 1 tablet by mouth daily.   VITAMIN D3 PO Take 2,000 Units by mouth daily.   Zinc 100 MG Tabs Take 100 mg by mouth daily.           Outstanding Labs/Studies   Event monitor order has been placed  Duration of Discharge Encounter   Greater than 30 minutes including physician time.  Signed, Nada Boozer, NP 08/13/2020, 2:56 PM

## 2020-08-15 ENCOUNTER — Encounter (HOSPITAL_COMMUNITY): Payer: Self-pay | Admitting: Interventional Cardiology

## 2020-08-16 ENCOUNTER — Telehealth: Payer: Self-pay | Admitting: Cardiology

## 2020-08-16 DIAGNOSIS — R079 Chest pain, unspecified: Secondary | ICD-10-CM

## 2020-08-16 DIAGNOSIS — R0602 Shortness of breath: Secondary | ICD-10-CM

## 2020-08-16 NOTE — Telephone Encounter (Signed)
New Message:     Patient said Dr Dulce Sellar was supposed to be referring him to a Pulmonary doctor. He says he have not heard anything.

## 2020-08-17 NOTE — Telephone Encounter (Signed)
Called pt but pt has a full mailbox.

## 2020-08-18 NOTE — Telephone Encounter (Signed)
Pt aware of referral.

## 2020-08-23 NOTE — Telephone Encounter (Signed)
Patient is following up regarding the pulmonology referral. He states he was completely unaware of this. He states he didn't know he was being referred to a pulmonologist and he would like a call back with clarification.

## 2020-08-23 NOTE — Telephone Encounter (Signed)
Spoke with the pt that the referral was placed on 08/17/20. Name of provider and phone number was given as well. The message the pt was calling for was that he had not heard anything from the referral office as he was aware of the referral and was following up.

## 2020-08-23 NOTE — Telephone Encounter (Signed)
Left message on patients voicemail to please return our call.   

## 2020-08-26 ENCOUNTER — Telehealth: Payer: Self-pay | Admitting: Cardiology

## 2020-08-26 MED ORDER — ISOSORBIDE MONONITRATE ER 30 MG PO TB24: 30.0000 mg | ORAL_TABLET | Freq: Every day | ORAL | 3 refills | Status: DC

## 2020-08-26 NOTE — Telephone Encounter (Signed)
Spoke to the patient just now and let him know Dr. Munley's recommendations. He verbalizes understanding and thanks me for the call back.   Encouraged patient to call back with any questions or concerns.  

## 2020-08-26 NOTE — Addendum Note (Signed)
Addended by: Delorse Limber I on: 08/26/2020 02:44 PM   Modules accepted: Orders

## 2020-08-26 NOTE — Telephone Encounter (Signed)
New message:      Patient said he was in [facility] for 3 days. He would like for Dr Dulce Sellar today when he have time to give him a call please. He said he wants to talk to him about his condition.

## 2020-08-26 NOTE — Telephone Encounter (Signed)
Pt is returning a call  

## 2020-08-26 NOTE — Telephone Encounter (Signed)
Left message on patients voicemail to please return our call.   

## 2020-08-26 NOTE — Telephone Encounter (Signed)
Spoke to the patient just now and he let me know that he was wondering what the plan was for him from here since his heart cath was normal. He states that he is still having the same pain that has been going on for some time now. He is going to see the pulmonology office on 10/04/2020.   I advised him that at this time his pain does not appear to be cardiac related but he would like to get recommendations from Dr. Dulce Sellar.

## 2020-09-04 DIAGNOSIS — E039 Hypothyroidism, unspecified: Secondary | ICD-10-CM | POA: Diagnosis not present

## 2020-09-04 DIAGNOSIS — E782 Mixed hyperlipidemia: Secondary | ICD-10-CM | POA: Diagnosis not present

## 2020-09-04 DIAGNOSIS — E1169 Type 2 diabetes mellitus with other specified complication: Secondary | ICD-10-CM | POA: Diagnosis not present

## 2020-09-16 ENCOUNTER — Telehealth: Payer: Self-pay | Admitting: Cardiology

## 2020-09-16 NOTE — Telephone Encounter (Signed)
Pt c/o medication issue:  1. Name of Medication: isosorbide mononitrate (IMDUR) 30 MG 24 hr tablet  2. How are you currently taking this medication (dosage and times per day)? 1 tablet daily except yesterday   3. Are you having a reaction (difficulty breathing--STAT)? Yes  4. What is your medication issue? IS causing dizziness and nausea with loss of energy. States he stopped taking yesterday and felt better started taking again today and started feeling bad again.

## 2020-09-16 NOTE — Telephone Encounter (Signed)
Spoke to the patient just now and let him know Dr. Tobb's recommendations. He verbalizes understanding.  

## 2020-09-16 NOTE — Addendum Note (Signed)
Addended by: Delorse Limber I on: 09/16/2020 03:15 PM   Modules accepted: Orders

## 2020-09-16 NOTE — Telephone Encounter (Signed)
Okay to have him stop this for now until he sees Dr. Dulce Sellar.

## 2020-10-04 ENCOUNTER — Other Ambulatory Visit: Payer: Self-pay

## 2020-10-04 ENCOUNTER — Encounter: Payer: Self-pay | Admitting: Pulmonary Disease

## 2020-10-04 ENCOUNTER — Ambulatory Visit: Payer: Medicare Other | Admitting: Pulmonary Disease

## 2020-10-04 VITALS — BP 128/72 | HR 60 | Ht 72.0 in | Wt 206.8 lb

## 2020-10-04 DIAGNOSIS — R0602 Shortness of breath: Secondary | ICD-10-CM

## 2020-10-04 LAB — CBC WITH DIFFERENTIAL/PLATELET
Basophils Absolute: 0.1 10*3/uL (ref 0.0–0.1)
Basophils Relative: 0.9 % (ref 0.0–3.0)
Eosinophils Absolute: 0.4 10*3/uL (ref 0.0–0.7)
Eosinophils Relative: 6.7 % — ABNORMAL HIGH (ref 0.0–5.0)
HCT: 40.3 % (ref 39.0–52.0)
Hemoglobin: 13.3 g/dL (ref 13.0–17.0)
Lymphocytes Relative: 22.2 % (ref 12.0–46.0)
Lymphs Abs: 1.4 10*3/uL (ref 0.7–4.0)
MCHC: 33.1 g/dL (ref 30.0–36.0)
MCV: 90 fl (ref 78.0–100.0)
Monocytes Absolute: 0.4 10*3/uL (ref 0.1–1.0)
Monocytes Relative: 6.9 % (ref 3.0–12.0)
Neutro Abs: 4.1 10*3/uL (ref 1.4–7.7)
Neutrophils Relative %: 63.3 % (ref 43.0–77.0)
Platelets: 159 10*3/uL (ref 150.0–400.0)
RBC: 4.48 Mil/uL (ref 4.22–5.81)
RDW: 13.7 % (ref 11.5–15.5)
WBC: 6.4 10*3/uL (ref 4.0–10.5)

## 2020-10-04 MED ORDER — FLUTICASONE FUROATE-VILANTEROL 200-25 MCG/INH IN AEPB: 1.0000 | INHALATION_SPRAY | Freq: Every day | RESPIRATORY_TRACT | 0 refills | Status: DC

## 2020-10-04 NOTE — Patient Instructions (Signed)
Check some labs including CBC differential, IgE We will start an inhaler called Breo Schedule pulmonary function test and high-resolution CT for better evaluation of the lung Follow-up in clinic after these tests.

## 2020-10-04 NOTE — Progress Notes (Signed)
Craig Armstrong    [id number]    1940-09-02  Primary Care Physician:Khan, Tamera Reason, MD  Referring Physician: Baldo Daub, MD [address] [place],  [place] [address]  Chief complaint: Consult for chest pain  HPI: 80 year old with history of diabetes, hyperlipidemia.  Complains of intermittent chest pain on exertion.  Describes chest tightness and ache.  Symptoms are associated with dyspnea.  Denies any cough, sputum production.  He has been evaluated by cardiology with cardiac cath showing nonobstructive coronary artery disease. Refer to pulmonary for further evaluation  Was diagnosed with COVID-19 infection in July 2022.  Presenting as diarrhea, GI upset.  He also had a vagal episode while driving to [place] during that time.  Pets: No pets Occupation: Works as a Education officer, environmental Exposures: No exposure.  No mold, hot tub, Jacuzzi.  No feather pillows or comforter Smoking history: Never smoker Travel history: Recently lived in [place].  No significant recent travel Relevant family history: No family history of lung disease  Outpatient Encounter Medications as of 10/04/2020  Medication Sig   Ascorbic Acid (VITAMIN C ER PO) Take 1 tablet by mouth daily.   aspirin EC 81 MG tablet Take 81 mg by mouth daily.   Cholecalciferol (VITAMIN D3 PO) Take 2,000 Units by mouth daily.   Coenzyme Q10 300 MG CAPS Take 300 mg by mouth daily.   cyanocobalamin (,VITAMIN B-12,) 1000 MCG/ML injection Inject 1,000 mcg into the muscle every 30 (thirty) days.   insulin degludec (TRESIBA FLEXTOUCH) 100 UNIT/ML FlexTouch Pen Inject 20 Units into the skin daily.   levothyroxine (SYNTHROID, LEVOTHROID) 112 MCG tablet Take 112 mcg by mouth daily before breakfast.    NON FORMULARY Take 1 Scoop by mouth every other day. VITAL REDS   rosuvastatin (CRESTOR) 10 MG tablet Take 1 tablet (10 mg total) by mouth daily.   tamsulosin (FLOMAX) 0.4 MG CAPS capsule Take 0.4 mg by mouth daily.   Zinc 100 MG TABS Take 100 mg by  mouth daily.   mometasone (NASONEX) 50 MCG/ACT nasal spray Place 1 spray into the nose daily as needed for congestion. (Patient not taking: Reported on 10/04/2020)   [DISCONTINUED] Semaglutide,0.25 or 0.5MG /DOS, (OZEMPIC, 0.25 OR 0.5 MG/DOSE,) 2 MG/1.5ML SOPN Inject 0.25 mg into the skin once a week.   No facility-administered encounter medications on file as of 10/04/2020.    Allergies as of 10/04/2020 - Review Complete 10/04/2020  Allergen Reaction Noted   Atorvastatin Other (See Comments) 08/25/2015   Lisinopril Other (See Comments) 07/28/2007   Ranexa [ranolazine] Other (See Comments) 04/10/2018   Zocor [simvastatin] Other (See Comments) 02/27/2018   Sulfa antibiotics Other (See Comments) 08/25/2015    Past Medical History:  Diagnosis Date   Chest pain 08/25/2015   COVID-19 08/13/2020   Essential hypertension 02/27/2018   Hyperlipidemia 08/31/2015   Mild CAD 02/27/2018   Type 2 diabetes mellitus (HCC) 08/31/2015   Type 2 diabetes mellitus with insulin therapy (HCC)    Unstable angina (HCC) 08/31/2015    Past Surgical History:  Procedure Laterality Date   APPENDECTOMY     CARDIAC CATHETERIZATION     HERNIA REPAIR     LEFT HEART CATH AND CORONARY ANGIOGRAPHY N/A 08/12/2020   Procedure: LEFT HEART CATH AND CORONARY ANGIOGRAPHY;  Surgeon: Corky Crafts, MD;  Location: [facility] INVASIVE CV LAB;  Service: Cardiovascular;  Laterality: N/A;   TONSILLECTOMY      Family History  Problem Relation Age of Onset   Hyperlipidemia  Mother    Thyroid cancer Mother    Diabetes Father    Heart attack Maternal Grandfather     Social History   Socioeconomic History   Marital status: Married    Spouse name: Not on file   Number of children: Not on file   Years of education: Not on file   Highest education level: Not on file  Occupational History   Not on file  Tobacco Use   Smoking status: Never   Smokeless tobacco: Never  Vaping Use   Vaping Use: Never used  Substance and Sexual  Activity   Alcohol use: Not Currently   Drug use: Not Currently   Sexual activity: Not on file  Other Topics Concern   Not on file  Social History Narrative   Not on file   Social Determinants of Health   Financial Resource Strain: Not on file  Food Insecurity: Not on file  Transportation Needs: Not on file  Physical Activity: Not on file  Stress: Not on file  Social Connections: Not on file  Intimate Partner Violence: Not on file    Review of systems: Review of Systems  Constitutional: Negative for fever and chills.  HENT: Negative.   Eyes: Negative for blurred vision.  Respiratory: as per HPI  Cardiovascular: Negative for chest pain and palpitations.  Gastrointestinal: Negative for vomiting, diarrhea, blood per rectum. Genitourinary: Negative for dysuria, urgency, frequency and hematuria.  Musculoskeletal: Negative for myalgias, back pain and joint pain.  Skin: Negative for itching and rash.  Neurological: Negative for dizziness, tremors, focal weakness, seizures and loss of consciousness.  Endo/Heme/Allergies: Negative for environmental allergies.  Psychiatric/Behavioral: Negative for depression, suicidal ideas and hallucinations.  All other systems reviewed and are negative.  Physical Exam: Blood pressure 128/72, pulse 60, height 6' (1.829 m), weight 206 lb 12.8 oz (93.8 kg), SpO2 100 %. Gen:      No acute distress HEENT:  EOMI, sclera anicteric Neck:     No masses; no thyromegaly Lungs:    Clear to auscultation bilaterally; normal respiratory effort CV:         Regular rate and rhythm; no murmurs Abd:      + bowel sounds; soft, non-tender; no palpable masses, no distension Ext:    No edema; adequate peripheral perfusion Skin:      Warm and dry; no rash Neuro: alert and oriented x 3 Psych: normal mood and affect  Data Reviewed: Imaging: CT coronaries 03/26/18-2 mm pulmonary nodule, hypovascular hepatic lesions  Chest x-ray 08/11/2020-mild chronic markings at the  base I reviewed the images personally.  PFTs:  Labs:  Cardiac: Cardiac cath 08/12/2020 Mid LAD lesion is 25% stenosed. The left ventricular systolic function is normal. LV end diastolic pressure is normal. The left ventricular ejection fraction is 55-65% by visual estimate. There is no aortic valve stenosis   Assessment:  Assessment for dyspnea, atypical chest pain He may have reactive airway disease presenting as chest tightness.  Lung imaging on my review does show some mild increase in markings at the base and he will need evaluation for interstitial lung disease Can check baseline CBC, IgE Schedule PFTs and high-res CT Trial breo inhaler  Plan/Recommendations: CBC, IgE PFTs, high-res CT Bonita Quin MD [facility] 10/04/2020, 9:47 AM  CC: Baldo Daub, MD

## 2020-10-05 DIAGNOSIS — E039 Hypothyroidism, unspecified: Secondary | ICD-10-CM | POA: Diagnosis not present

## 2020-10-05 DIAGNOSIS — E782 Mixed hyperlipidemia: Secondary | ICD-10-CM | POA: Diagnosis not present

## 2020-10-05 DIAGNOSIS — E1169 Type 2 diabetes mellitus with other specified complication: Secondary | ICD-10-CM | POA: Diagnosis not present

## 2020-10-05 LAB — IGE: IgE (Immunoglobulin E), Serum: 865 kU/L — ABNORMAL HIGH (ref <=114)

## 2020-10-06 ENCOUNTER — Encounter: Payer: Self-pay | Admitting: Pulmonary Disease

## 2020-10-12 ENCOUNTER — Other Ambulatory Visit: Payer: Self-pay

## 2020-10-12 ENCOUNTER — Ambulatory Visit (INDEPENDENT_AMBULATORY_CARE_PROVIDER_SITE_OTHER)
Admission: RE | Admit: 2020-10-12 | Discharge: 2020-10-12 | Disposition: A | Discharge status: Home / Self Care | Payer: Medicare Other | Source: Ambulatory Visit | Attending: Pulmonary Disease | Admitting: Pulmonary Disease

## 2020-10-12 DIAGNOSIS — R0602 Shortness of breath: Secondary | ICD-10-CM

## 2020-10-12 DIAGNOSIS — I700 Atherosclerosis of aorta: Secondary | ICD-10-CM | POA: Diagnosis not present

## 2020-10-12 DIAGNOSIS — R918 Other nonspecific abnormal finding of lung field: Secondary | ICD-10-CM | POA: Diagnosis not present

## 2020-10-12 DIAGNOSIS — J849 Interstitial pulmonary disease, unspecified: Secondary | ICD-10-CM | POA: Diagnosis not present

## 2020-10-12 DIAGNOSIS — R911 Solitary pulmonary nodule: Secondary | ICD-10-CM | POA: Diagnosis not present

## 2020-10-14 ENCOUNTER — Telehealth: Payer: Self-pay | Admitting: Pulmonary Disease

## 2020-10-14 NOTE — Telephone Encounter (Signed)
Attempted to call pt but unable to reach. Left message for him to return call. °

## 2020-10-17 NOTE — Progress Notes (Signed)
I called patient and left a message for patient to call back for his results.

## 2020-10-18 NOTE — Telephone Encounter (Signed)
ATC Patient.  LM to call back. 

## 2020-10-19 ENCOUNTER — Telehealth: Payer: Self-pay | Admitting: Pulmonary Disease

## 2020-10-19 MED ORDER — FLUTICASONE FUROATE-VILANTEROL 200-25 MCG/INH IN AEPB: 1.0000 | INHALATION_SPRAY | Freq: Every day | RESPIRATORY_TRACT | 5 refills | Status: DC

## 2020-10-19 NOTE — Telephone Encounter (Signed)
I have called and LM on VM for the pt to call back.  Nothing further is needed.

## 2020-10-19 NOTE — Telephone Encounter (Signed)
I have called and LM on VM for the pt again.  He will need to call his insurance company to find out what other meds are covered.

## 2020-10-19 NOTE — Telephone Encounter (Signed)
Rx for Craig Armstrong has been sent to preferred pharmacy for pt. Attempted to call pt but unable to reach. Left pt a detailed message letting him know this had been done. Nothing further needed.

## 2020-10-20 NOTE — Telephone Encounter (Signed)
States insurance will cover breo inhaler. Does not need a new rx. Nothing further needed.

## 2020-10-21 ENCOUNTER — Encounter: Payer: Self-pay | Admitting: Pulmonary Disease

## 2020-10-27 ENCOUNTER — Telehealth: Payer: Self-pay | Admitting: Pulmonary Disease

## 2020-10-27 NOTE — Telephone Encounter (Signed)
Called and spoke with patient regarding CT results. Advised him of Dr. Shirlee More recs. Patient verbalized understanding. Advised that Dr. Isaiah Serge will go over results more in detail at his upcoming visit.   Nothing further needed at this time.

## 2020-10-28 DIAGNOSIS — D510 Vitamin B12 deficiency anemia due to intrinsic factor deficiency: Secondary | ICD-10-CM | POA: Diagnosis not present

## 2020-10-31 DIAGNOSIS — E785 Hyperlipidemia, unspecified: Secondary | ICD-10-CM | POA: Diagnosis not present

## 2020-10-31 DIAGNOSIS — Z79899 Other long term (current) drug therapy: Secondary | ICD-10-CM | POA: Diagnosis not present

## 2020-10-31 DIAGNOSIS — E039 Hypothyroidism, unspecified: Secondary | ICD-10-CM | POA: Diagnosis not present

## 2020-10-31 DIAGNOSIS — E1169 Type 2 diabetes mellitus with other specified complication: Secondary | ICD-10-CM | POA: Diagnosis not present

## 2020-11-01 ENCOUNTER — Telehealth: Payer: Self-pay | Admitting: Pulmonary Disease

## 2020-11-01 NOTE — Telephone Encounter (Signed)
Called and spoke with pt and he stated that when he started the [facility] he could tell a difference in his breathing.  He stated that now his breathing is back to the way it was and he cannot tell a difference with the [facility].  He is wanting to see if he can try something else.  PM please advise. Thanks  Last seen on 10/04/2020

## 2020-11-03 DIAGNOSIS — M7742 Metatarsalgia, left foot: Secondary | ICD-10-CM | POA: Diagnosis not present

## 2020-11-03 DIAGNOSIS — M6702 Short Achilles tendon (acquired), left ankle: Secondary | ICD-10-CM | POA: Diagnosis not present

## 2020-11-03 DIAGNOSIS — E1142 Type 2 diabetes mellitus with diabetic polyneuropathy: Secondary | ICD-10-CM | POA: Diagnosis not present

## 2020-11-03 NOTE — Telephone Encounter (Signed)
Please send in prescription for Symbicort 160, 2 puffs twice daily

## 2020-11-03 NOTE — Telephone Encounter (Signed)
Symbicort 160 is not listed as preferred inhaler for Patient insurance. Patient insurance preferred inhalers are- Advair HFA, Advair diskus, Symbicort 80, Dulera 100, Dulera 200, Breo 100, Breo 200, Breztri, and Trelegy.   Message routed to Dr. Isaiah Serge to advise

## 2020-11-04 DIAGNOSIS — E039 Hypothyroidism, unspecified: Secondary | ICD-10-CM | POA: Diagnosis not present

## 2020-11-04 DIAGNOSIS — E785 Hyperlipidemia, unspecified: Secondary | ICD-10-CM | POA: Diagnosis not present

## 2020-11-04 DIAGNOSIS — E1169 Type 2 diabetes mellitus with other specified complication: Secondary | ICD-10-CM | POA: Diagnosis not present

## 2020-11-04 DIAGNOSIS — E782 Mixed hyperlipidemia: Secondary | ICD-10-CM | POA: Diagnosis not present

## 2020-11-04 MED ORDER — MOMETASONE FURO-FORMOTEROL FUM 200-5 MCG/ACT IN AERO: 2.0000 | INHALATION_SPRAY | Freq: Two times a day (BID) | RESPIRATORY_TRACT | 5 refills | Status: DC

## 2020-11-04 NOTE — Telephone Encounter (Signed)
Please try Dulera 200

## 2020-11-04 NOTE — Telephone Encounter (Signed)
Called and spoke with patient. He verbalized understanding about Dulera. He wishes to have the RX sent to [facility] in [place]. RX has been sent.   Nothing further needed at time of call.

## 2020-11-10 ENCOUNTER — Other Ambulatory Visit: Payer: Self-pay

## 2020-11-10 ENCOUNTER — Encounter: Payer: Self-pay | Admitting: Cardiology

## 2020-11-10 ENCOUNTER — Ambulatory Visit: Payer: Medicare Other | Admitting: Cardiology

## 2020-11-10 ENCOUNTER — Ambulatory Visit (INDEPENDENT_AMBULATORY_CARE_PROVIDER_SITE_OTHER): Payer: Medicare Other

## 2020-11-10 VITALS — BP 110/72 | HR 60 | Ht 72.0 in | Wt 206.8 lb

## 2020-11-10 DIAGNOSIS — R001 Bradycardia, unspecified: Secondary | ICD-10-CM

## 2020-11-10 DIAGNOSIS — I4510 Unspecified right bundle-branch block: Secondary | ICD-10-CM | POA: Diagnosis not present

## 2020-11-10 DIAGNOSIS — I10 Essential (primary) hypertension: Secondary | ICD-10-CM

## 2020-11-10 DIAGNOSIS — I2510 Atherosclerotic heart disease of native coronary artery without angina pectoris: Secondary | ICD-10-CM | POA: Diagnosis not present

## 2020-11-10 DIAGNOSIS — E782 Mixed hyperlipidemia: Secondary | ICD-10-CM

## 2020-11-10 DIAGNOSIS — I498 Other specified cardiac arrhythmias: Secondary | ICD-10-CM | POA: Diagnosis not present

## 2020-11-10 DIAGNOSIS — R931 Abnormal findings on diagnostic imaging of heart and coronary circulation: Secondary | ICD-10-CM | POA: Diagnosis not present

## 2020-11-10 MED ORDER — ISOSORBIDE MONONITRATE ER 30 MG PO TB24: 30.0000 mg | ORAL_TABLET | Freq: Every day | ORAL | 3 refills | Status: DC

## 2020-11-10 NOTE — Patient Instructions (Signed)
Medication Instructions:  Your physician has recommended you make the following change in your medication:  START: Imdur 30 mg take one tablet by mouth daily.  *If you need a refill on your cardiac medications before your next appointment, please call your pharmacy*   Lab Work: None If you have labs (blood work) drawn today and your tests are completely normal, you will receive your results only by: MyChart Message (if you have MyChart) OR A paper copy in the mail If you have any lab test that is abnormal or we need to change your treatment, we will call you to review the results.   Testing/Procedures: A zio monitor was ordered today. It will remain on for 3 days. You will then return monitor and event diary in provided box. It takes 1-2 weeks for report to be downloaded and returned to [place]. We will call you with the results. If monitor falls off or has orange flashing light, please call Zio for further instructions.     Follow-Up: At [facility], you and your health needs are our priority.  As part of our continuing mission to provide you with exceptional heart care, we have created designated Provider Care Teams.  These Care Teams include your primary Cardiologist (physician) and Advanced Practice Providers (APPs -  Physician Assistants and Nurse Practitioners) who all work together to provide you with the care you need, when you need it.  We recommend signing up for the patient portal called "MyChart".  Sign up information is provided on this After Visit Summary.  MyChart is used to connect with patients for Virtual Visits (Telemedicine).  Patients are able to view lab/test results, encounter notes, upcoming appointments, etc.  Non-urgent messages can be sent to your provider as well.   To learn more about what you can do with MyChart, go to [email or web address].    Your next appointment:   6 week(s)  The format for your next appointment:   In Person  Provider:   Dr.  Dulce Sellar   Other Instructions

## 2020-11-10 NOTE — Progress Notes (Signed)
Cardiology Office Note:    Date:  11/10/2020   ID:  Craig Armstrong, DOB 1940/08/02, MRN [id number]  PCP:  Lise Auer, MD  Cardiologist:  Norman Herrlich, MD    Referring MD: Lise Auer, MD    ASSESSMENT:    1. Agatston coronary artery calcium score between 200 and 399   2. Mild CAD   3. RBBB, chronic    4. Junctional rhythm   5. Essential hypertension   6. Mixed hyperlipidemia   7. Bradycardia    PLAN:    In order of problems listed above:  He has a very high calcium score continue his high intensity statin Ongoing anginal symptoms most consistent with coronary microvascular disease avoid rate slowing medications 12 oral nitrate if ineffective consider ranolazine or Rate limiting calcium channel blocker Concerns with syncope and exercise intolerance and resting bradycardia applied 3-day monitor regarding chronotropic incompetence potential need for pacemaker Stable  currently not on antihypertensive drug Continues high intensity statin  Next appointment: 6 weeks   Medication Adjustments/Labs and Tests Ordered: Current medicines are reviewed at length with the patient today.  Concerns regarding medicines are outlined above.  Orders Placed This Encounter  Procedures   LONG TERM MONITOR (3-14 DAYS)   Meds ordered this encounter  Medications   isosorbide mononitrate (IMDUR) 30 MG 24 hr tablet    Sig: Take 1 tablet (30 mg total) by mouth daily.    Dispense:  90 tablet    Refill:  3    Chief complaint: I have the same problem slow heart rate weakness exercise intolerance and chest pain with activity   History of Present Illness:    Craig Armstrong is a 80 y.o. male with a hx of elevated coronary artery calcium score of 385 57th percentile February 2020 with nonobstructive CAD hypertension hyperlipidemia and type 2 diabetes mellitus and right bundle branch block last seen 08/13/2020 admitted to [facility] with unstable angina pectoris.  His high-sensitivity  troponin was normal he had a brief junctional rhythm associated with chest pain right bundle branch block and underwent coronary angiography showing mild nonobstructive CAD 25% mid LAD normal left ventricular function.  He also had an echocardiogram performed during the admission in July 2022 which showed normal left ventricular function wall thickness size normal right ventricular function size and pulmonary artery pressure.  Compliance with diet, lifestyle and medications: Yes  He is unimproved with his inhaler He has marked exercise intolerance weakness resting heart rate is in the range of 45 to 47 bpm He said no further episodes of loss of consciousness. He has no chest pain at rest but at physical activity he gets pressure in his chest relieved with rest not severe or limiting. 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 He tolerates his statin without muscle pain or weakness  Chest CT showed a 2 mm left lower lobe pulmonary nodule nonspecific he had three-vessel coronary artery calcification aortic atherosclerosis and no findings of interstitial lung disease Past Medical History:  Diagnosis Date   Chest pain 08/25/2015   COVID-19 08/13/2020   Essential  hypertension 02/27/2018   Hyperlipidemia 08/31/2015   Mild CAD 02/27/2018   Type 2 diabetes mellitus (HCC) 08/31/2015   Type 2 diabetes mellitus with insulin therapy (HCC)    Unstable angina (HCC) 08/31/2015    Past Surgical History:  Procedure Laterality Date   APPENDECTOMY     CARDIAC CATHETERIZATION     HERNIA  REPAIR     LEFT HEART CATH AND CORONARY ANGIOGRAPHY N/A 08/12/2020   Procedure: LEFT HEART CATH AND CORONARY ANGIOGRAPHY;  Surgeon: Corky Crafts, MD;  Location: [facility] INVASIVE CV LAB;  Service: Cardiovascular;  Laterality: N/A;   TONSILLECTOMY      Current Medications: Current Meds  Medication Sig   Ascorbic Acid (VITAMIN C ER PO) Take 1 tablet by mouth daily.   aspirin EC 81 MG tablet Take 81 mg by mouth daily.   Cholecalciferol (VITAMIN D3 PO) Take 2,000 Units by mouth daily.   Coenzyme Q10 300 MG CAPS Take 300 mg by mouth daily.   cyanocobalamin (,VITAMIN B-12,) 1000 MCG/ML injection Inject 1,000 mcg into the muscle every 30 (thirty) days.   insulin degludec (TRESIBA FLEXTOUCH) 100 UNIT/ML FlexTouch Pen Inject 24 Units into the skin daily.   isosorbide mononitrate (IMDUR) 30 MG 24 hr tablet Take 1 tablet (30 mg total) by mouth daily.   levothyroxine (SYNTHROID, LEVOTHROID) 112 MCG tablet Take 112 mcg by mouth daily before breakfast.    mometasone-formoterol (DULERA) 200-5 MCG/ACT AERO Inhale 2 puffs into the lungs 2 (two) times daily.   NON FORMULARY Take 1 Scoop by mouth every other day. VITAL REDS   rosuvastatin (CRESTOR) 10 MG tablet Take 1 tablet (10 mg total) by mouth daily.   tamsulosin (FLOMAX) 0.4 MG CAPS capsule Take 0.4 mg by mouth daily.   TURMERIC PO Take 1 capsule by mouth daily.   UNABLE TO FIND Med Name: BEET ROOT POWDER DAILY   Zinc 100 MG TABS Take 100 mg by mouth daily.     Allergies:   Atorvastatin, Ranexa [ranolazine], Zocor [simvastatin], and Sulfa antibiotics   Social History   Socioeconomic History   Marital status: Married    Spouse name: Not on file   Number of children: Not on file   Years of education: Not on file   Highest education level: Not on file  Occupational History   Not on file  Tobacco Use   Smoking status: Never   Smokeless tobacco: Never  Vaping Use   Vaping Use: Never used  Substance and Sexual Activity   Alcohol use: Not  Currently   Drug use: Not Currently   Sexual activity: Not on file  Other Topics Concern   Not on file  Social History Narrative   Not on file   Social Determinants of Health   Financial Resource Strain: Not on file  Food Insecurity: Not on file  Transportation Needs: Not on file  Physical Activity: Not on file  Stress: Not on file  Social Connections: Not on file     Family History: The patient's family history includes Diabetes in his father; Heart attack in his maternal grandfather; Hyperlipidemia in his mother; Thyroid cancer in his mother. ROS:   Please see the history of present illness.    All other systems reviewed and are negative.  EKGs/Labs/Other Studies Reviewed:    The following studies were reviewed today:    Recent Labs: 08/11/2020: TSH 1.523 08/12/2020: ALT 15; BUN 19; Creatinine, Ser 1.17; Potassium 4.0; Sodium 138 10/04/2020: Hemoglobin 13.3; Platelets 159.0  Recent Lipid  Panel    Component Value Date/Time   CHOL 170 08/12/2020 0148   TRIG 55 08/12/2020 0148   HDL 41 08/12/2020 0148   CHOLHDL 4.1 08/12/2020 0148   VLDL 11 08/12/2020 0148   LDLCALC 118 (H) 08/12/2020 0148    Physical Exam:    VS:  BP 110/72 (BP Location: Right Arm, Patient Position: Sitting)   Pulse 60   Ht 6' (1.829 m)   Wt 206 lb 12.8 oz (93.8 kg)   SpO2 96%   BMI 28.05 kg/m     Wt Readings from Last 3 Encounters:  11/10/20 206 lb 12.8 oz (93.8 kg)  10/04/20 206 lb 12.8 oz (93.8 kg)  08/12/20 203 lb (92.1 kg)     GEN:  Well nourished, well developed in no acute distress HEENT: Normal NECK: No JVD; No carotid bruits LYMPHATICS: No lymphadenopathy CARDIAC: RRR, no murmurs, rubs, gallops RESPIRATORY:  Clear to auscultation without rales, wheezing or rhonchi  ABDOMEN: Soft, non-tender, non-distended MUSCULOSKELETAL:  No edema; No deformity  SKIN: Warm and dry NEUROLOGIC:  Alert and oriented x 3 PSYCHIATRIC:  Normal affect    Signed, Norman Herrlich, MD  11/10/2020 2:30  PM    [facility]

## 2020-11-18 DIAGNOSIS — R001 Bradycardia, unspecified: Secondary | ICD-10-CM | POA: Diagnosis not present

## 2020-11-24 ENCOUNTER — Ambulatory Visit (INDEPENDENT_AMBULATORY_CARE_PROVIDER_SITE_OTHER): Payer: Medicare Other | Admitting: Pulmonary Disease

## 2020-11-24 ENCOUNTER — Other Ambulatory Visit: Payer: Self-pay

## 2020-11-24 ENCOUNTER — Encounter: Payer: Self-pay | Admitting: Pulmonary Disease

## 2020-11-24 ENCOUNTER — Ambulatory Visit: Payer: Medicare Other | Admitting: Pulmonary Disease

## 2020-11-24 VITALS — BP 118/66 | HR 54 | Temp 98.0°F | Ht 72.0 in | Wt 207.8 lb

## 2020-11-24 DIAGNOSIS — R0602 Shortness of breath: Secondary | ICD-10-CM | POA: Diagnosis not present

## 2020-11-24 DIAGNOSIS — R079 Chest pain, unspecified: Secondary | ICD-10-CM | POA: Diagnosis not present

## 2020-11-24 LAB — PULMONARY FUNCTION TEST
DL/VA % pred: 115 %
DL/VA: 4.5 ml/min/mmHg/L
DLCO cor % pred: 91 %
DLCO cor: 23.94 ml/min/mmHg
DLCO unc % pred: 91 %
DLCO unc: 23.94 ml/min/mmHg
FEF 25-75 Post: 2.74 L/sec
FEF 25-75 Pre: 1.96 L/sec
FEF2575-%Change-Post: 39 %
FEF2575-%Pred-Post: 120 %
FEF2575-%Pred-Pre: 86 %
FEV1-%Change-Post: 8 %
FEV1-%Pred-Post: 83 %
FEV1-%Pred-Pre: 76 %
FEV1-Post: 2.68 L
FEV1-Pre: 2.47 L
FEV1FVC-%Change-Post: 3 %
FEV1FVC-%Pred-Pre: 106 %
FEV6-%Change-Post: 3 %
FEV6-%Pred-Post: 79 %
FEV6-%Pred-Pre: 77 %
FEV6-Post: 3.34 L
FEV6-Pre: 3.22 L
FEV6FVC-%Change-Post: -1 %
FEV6FVC-%Pred-Post: 104 %
FEV6FVC-%Pred-Pre: 106 %
FVC-%Change-Post: 4 %
FVC-%Pred-Post: 76 %
FVC-%Pred-Pre: 72 %
FVC-Post: 3.39 L
FVC-Pre: 3.23 L
Post FEV1/FVC ratio: 79 %
Post FEV6/FVC ratio: 98 %
Pre FEV1/FVC ratio: 76 %
Pre FEV6/FVC Ratio: 100 %
RV % pred: 88 %
RV: 2.4 L
TLC % pred: 75 %
TLC: 5.65 L

## 2020-11-24 NOTE — Patient Instructions (Signed)
I am glad you are doing well with regard to your breathing I have reviewed your lung function test which does not show significant abnormalities.  CT chest is normal Okay to stop the [facility] Follow-up as needed

## 2020-11-24 NOTE — Progress Notes (Signed)
Craig Armstrong    [id number]    05-Sep-1940  Primary Care Physician:Khan, Tamera Reason, MD  Referring Physician: Lise Auer, MD [address] [place],  [place] [address]  Chief complaint: Follow-up for atypical chest pain  HPI: 80 year old with history of diabetes, hyperlipidemia.  Complains of intermittent chest pain on exertion.  Describes chest tightness and ache.  Symptoms are associated with dyspnea.  Denies any cough, sputum production.  He has been evaluated by cardiology with cardiac cath showing nonobstructive coronary artery disease. Refer to pulmonary for further evaluation  Was diagnosed with COVID-19 infection in July 2022.  Presenting as diarrhea, GI upset.  He also had a vagal episode while driving to [place] during that time.  Pets: No pets Occupation: Works as a Education officer, environmental Exposures: No exposure.  No mold, hot tub, Jacuzzi.  No feather pillows or comforter Smoking history: Never smoker Travel history: Recently lived in [place].  No significant recent travel Relevant family history: No family history of lung disease  Interval history: He was started on Dulera at last visit.  He stopped it as it did not make any difference Here for review of CT chest and PFTs  Overall is stable.  Continues to have occasional intermittent chest pain with atypical presentation.  Outpatient Encounter Medications as of 11/24/2020  Medication Sig   Ascorbic Acid (VITAMIN C ER PO) Take 1 tablet by mouth daily.   aspirin EC 81 MG tablet Take 81 mg by mouth daily.   Cholecalciferol (VITAMIN D3 PO) Take 2,000 Units by mouth daily.   Coenzyme Q10 300 MG CAPS Take 300 mg by mouth daily.   cyanocobalamin (,VITAMIN B-12,) 1000 MCG/ML injection Inject 1,000 mcg into the muscle every 30 (thirty) days.   insulin degludec (TRESIBA FLEXTOUCH) 100 UNIT/ML FlexTouch Pen Inject 24 Units into the skin daily.   isosorbide mononitrate (IMDUR) 30 MG 24 hr tablet Take 1 tablet (30 mg total) by mouth  daily.   levothyroxine (SYNTHROID, LEVOTHROID) 112 MCG tablet Take 112 mcg by mouth daily before breakfast.    NON FORMULARY Take 1 Scoop by mouth every other day. VITAL REDS   rosuvastatin (CRESTOR) 10 MG tablet Take 1 tablet (10 mg total) by mouth daily.   tamsulosin (FLOMAX) 0.4 MG CAPS capsule Take 0.4 mg by mouth daily.   TURMERIC PO Take 1 capsule by mouth daily.   UNABLE TO FIND Med Name: BEET ROOT POWDER DAILY   Zinc 100 MG TABS Take 100 mg by mouth daily.   mometasone-formoterol (DULERA) 200-5 MCG/ACT AERO Inhale 2 puffs into the lungs 2 (two) times daily.   No facility-administered encounter medications on file as of 11/24/2020.   Physical Exam: Blood pressure 118/66, pulse (!) 54, temperature 98 F (36.7 C), temperature source Oral, height 6' (1.829 m), weight 207 lb 12.8 oz (94.3 kg), SpO2 100 %. Gen:      No acute distress HEENT:  EOMI, sclera anicteric Neck:     No masses; no thyromegaly Lungs:    Clear to auscultation bilaterally; normal respiratory effort CV:         Regular rate and rhythm; no murmurs Abd:      + bowel sounds; soft, non-tender; no palpable masses, no distension Ext:    No edema; adequate peripheral perfusion Skin:      Warm and dry; no rash Neuro: alert and oriented x 3 Psych: normal mood and affect   Data Reviewed: Imaging: CT coronaries 2/19/2- 2  mm right middle lobe pulmonary nodule, hypovascular hepatic lesions  Chest x-ray 08/11/2020-mild chronic markings at the base  High-res CT 10/12/2020-no ILD, 2 mm left lower lobe pulmonary nodule, aortic atherosclerosis I reviewed the images personally.  PFTs: 11/24/2020 FVC 3.39 [76%], FEV1 2.68 [83%], F/F 79, TLC 5.65 [75%], DLCO 23.94 [91%] Mild restriction defect  Labs: CBC 10/04/2020-WBC 6.4, eos 6.7%, absolute eosinophil count 429 IgE 10/04/2020- 865  Cardiac: Cardiac cath 08/12/2020 Mid LAD lesion is 25% stenosed. The left ventricular systolic function is normal. LV end diastolic pressure is  normal. The left ventricular ejection fraction is 55-65% by visual estimate. There is no aortic valve stenosis   Assessment:  Assessment for dyspnea, atypical chest pain Pulmonary work-up so far has been unremarkable PFTs do show mild restriction defect which is suspected secondary to body habitus as there is no evidence of interstitial lung disease on CT scan.  There is no evidence of post-COVID lung issues.  2 mm pulmonary nodule is likely benign in a non-smoker  He does have elevated eosinophils and IgE secondary to allergies but no objective evidence of asthma as he has no obstruction on PFTs and has not responded to [facility].  He has discontinued the inhaler and does not want to retry it  All tests reviewed in detail with patient today.  Continue medical management of coronary artery disease Follow-up in pulmonary clinic as needed  Plan/Recommendations: Follow-up as needed  Chilton Greathouse MD [facility] 11/24/2020, 10:17 AM  CC: Lise Auer, MD

## 2020-11-24 NOTE — Progress Notes (Signed)
Full PFT completed today ? ?

## 2020-11-25 ENCOUNTER — Telehealth: Payer: Self-pay

## 2020-11-25 NOTE — Telephone Encounter (Signed)
-----   Message from Baldo Daub, MD sent at 11/25/2020  4:52 PM EDT ----- Normal or stable result  In general good result on the monitor extra beats are present but no severe problem and no additional medications are needed.

## 2020-11-25 NOTE — Telephone Encounter (Signed)
Spoke with patient regarding results and recommendation.  Patient verbalizes understanding and is agreeable to plan of care. Advised patient to call back with any issues or concerns.  

## 2020-12-05 DIAGNOSIS — E1169 Type 2 diabetes mellitus with other specified complication: Secondary | ICD-10-CM | POA: Diagnosis not present

## 2020-12-05 DIAGNOSIS — E782 Mixed hyperlipidemia: Secondary | ICD-10-CM | POA: Diagnosis not present

## 2020-12-05 DIAGNOSIS — E039 Hypothyroidism, unspecified: Secondary | ICD-10-CM | POA: Diagnosis not present

## 2020-12-22 DIAGNOSIS — R3912 Poor urinary stream: Secondary | ICD-10-CM | POA: Diagnosis not present

## 2020-12-22 DIAGNOSIS — N401 Enlarged prostate with lower urinary tract symptoms: Secondary | ICD-10-CM | POA: Diagnosis not present

## 2020-12-23 DIAGNOSIS — Z0000 Encounter for general adult medical examination without abnormal findings: Secondary | ICD-10-CM | POA: Diagnosis not present

## 2020-12-23 DIAGNOSIS — D510 Vitamin B12 deficiency anemia due to intrinsic factor deficiency: Secondary | ICD-10-CM | POA: Diagnosis not present

## 2020-12-27 NOTE — Progress Notes (Signed)
Cardiology Office Note:    Date:  12/28/2020   ID:  Demico Ploch, DOB 10-02-1940, MRN [id number]  PCP:  Lise Auer, MD  Cardiologist:  Norman Herrlich, MD    Referring MD: Lise Auer, MD    ASSESSMENT:    1. Cardiac microvascular disease   2. Mild CAD   3. Agatston coronary artery calcium score between 200 and 399   4. RBBB, chronic    5. Essential hypertension   6. Mixed hyperlipidemia    PLAN:    In order of problems listed above:  He has typical angina and a combination of mild nonobstructive CAD elevated coronary artery calcium score and typical findings of cardiac microvascular disease.  Symptoms persist or lifestyle disruptive and will initiate ranolazine which I think will be quite effective.  If not further evaluation cardiac MR may be appropriate Stable hypertension BP at target avoid rate slowing medications Continue his high intensity statin   Next appointment: 6 weeks to assess response to  treatment   Medication Adjustments/Labs and Tests Ordered: Current medicines are reviewed at length with the patient today.  Concerns regarding medicines are outlined above.  No orders of the defined types were placed in this encounter.  Meds ordered this encounter  Medications   ranolazine (RANEXA) 500 MG 12 hr tablet    Sig: Take 2 tablets (1,000 mg total) by mouth 2 (two) times daily.    Dispense:  360 tablet    Refill:  3     Chief Complaint  Patient presents with   Follow-up    Angina with mild nonobstructive CAD coronary microvascular disease and elevated coronary artery calcium score.    History of Present Illness:    Craig Armstrong is a 80 y.o. male with a hx of  elevated coronary artery calcium score of 385 57th percentile February 2020 with coronary microvascular disease and mild nonobstructive CAD hypertension hyperlipidemia and type 2 diabetes mellitus and right bundle branch block last seen 11/10/2020. He was admitted to [facility]  with unstable angina pectoris.  His high-sensitivity troponin was normal he had a brief junctional rhythm associated with chest pain right bundle branch block and underwent coronary angiography showing mild nonobstructive CAD 25% mid LAD normal left ventricular function.  He also had an echocardiogram performed during the admission in July 2022 which showed normal left ventricular function wall thickness size normal right ventricular function size and pulmonary artery pressure.   Compliance with diet, lifestyle and medications: Yes  I reviewed the results of his monitor with him. He continues to have typical exertional angina relieved with rest that interferes with his life. Reviewed for other therapeutic endeavors he was trialed ranolazine 500 mg twice a day 2 weeks check EKG QT interval advanced 2000 mg daily I am confident he will get a good functional result if not I may ask him to have further testing like cardiac MR performed with his combination of typical angina and frequent PVCs right bundle branch block and mild nonobstructive CAD.  He is having no muscle pain or weakness from his statin no edema orthopnea and no further syncope.  Utilized a 3-day monitor reported 11/26/2019 with frequent atrial premature contractions and brief runs of APCs he had frequent ventricular ectopy which was symptomatic and had no bradycardia. Past Medical History:  Diagnosis Date   Chest pain 08/25/2015   COVID-19 08/13/2020   Essential hypertension 02/27/2018   Hyperlipidemia 08/31/2015   Mild CAD 02/27/2018   Type 2  diabetes mellitus (HCC) 08/31/2015   Type 2 diabetes mellitus with insulin therapy (HCC)    Unstable angina (HCC) 08/31/2015    Past Surgical History:  Procedure Laterality Date   APPENDECTOMY     CARDIAC CATHETERIZATION     HERNIA REPAIR     LEFT HEART CATH AND CORONARY ANGIOGRAPHY N/A 08/12/2020   Procedure: LEFT HEART CATH AND CORONARY ANGIOGRAPHY;  Surgeon: Corky Crafts, MD;   Location: [facility] INVASIVE CV LAB;  Service: Cardiovascular;  Laterality: N/A;   TONSILLECTOMY      Current Medications: Current Meds  Medication Sig   Ascorbic Acid (VITAMIN C ER PO) Take 1 tablet by mouth daily.   aspirin EC 81 MG tablet Take 81 mg by mouth daily.   Cholecalciferol (VITAMIN D3 PO) Take 2,000 Units by mouth daily.   Coenzyme Q10 300 MG CAPS Take 300 mg by mouth daily.   cyanocobalamin (,VITAMIN B-12,) 1000 MCG/ML injection Inject 1,000 mcg into the muscle every 30 (thirty) days.   insulin degludec (TRESIBA FLEXTOUCH) 100 UNIT/ML FlexTouch Pen Inject 24 Units into the skin daily.   levothyroxine (SYNTHROID, LEVOTHROID) 112 MCG tablet Take 112 mcg by mouth daily before breakfast.    linagliptin (TRADJENTA) 5 MG TABS tablet Take 5 mg by mouth daily.   NON FORMULARY Take 1 Scoop by mouth every other day. VITAL REDS   ranolazine (RANEXA) 500 MG 12 hr tablet Take 2 tablets (1,000 mg total) by mouth 2 (two) times daily.   rosuvastatin (CRESTOR) 10 MG tablet Take 1 tablet (10 mg total) by mouth daily.   tamsulosin (FLOMAX) 0.4 MG CAPS capsule Take 0.4 mg by mouth daily.   TURMERIC PO Take 1 capsule by mouth daily.   UNABLE TO FIND Med Name: BEET ROOT POWDER DAILY   Zinc 100 MG TABS Take 100 mg by mouth daily.     Allergies:   Atorvastatin, Ranexa [ranolazine], Zocor [simvastatin], Sulfa antibiotics, and Sulfamethoxazole-trimethoprim   Social History   Socioeconomic History   Marital status: Married    Spouse name: Not on file   Number of children: Not on file   Years of education: Not on file   Highest education level: Not on file  Occupational History   Not on file  Tobacco Use   Smoking status: Never   Smokeless tobacco: Never  Vaping Use   Vaping Use: Never used  Substance and Sexual Activity   Alcohol use: Not Currently   Drug use: Not Currently   Sexual activity: Not on file  Other Topics Concern   Not on file  Social History Narrative   Not on file    Social Determinants of Health   Financial Resource Strain: Not on file  Food Insecurity: Not on file  Transportation Needs: Not on file  Physical Activity: Not on file  Stress: Not on file  Social Connections: Not on file     Family History: The patient's family history includes Diabetes in his father; Heart attack in his maternal grandfather; Hyperlipidemia in his mother; Thyroid cancer in his mother. ROS:   Please see the history of present illness.    All other systems reviewed and are negative.  EKGs/Labs/Other Studies Reviewed:    The following studies were reviewed today:    Recent Labs: 08/11/2020: TSH 1.523 08/12/2020: ALT 15; BUN 19; Creatinine, Ser 1.17; Potassium 4.0; Sodium 138 10/04/2020: Hemoglobin 13.3; Platelets 159.0  Recent Lipid Panel    Component Value Date/Time   CHOL 170 08/12/2020 0148  TRIG 55 08/12/2020 0148   HDL 41 08/12/2020 0148   CHOLHDL 4.1 08/12/2020 0148   VLDL 11 08/12/2020 0148   LDLCALC 118 (H) 08/12/2020 0148    Physical Exam:    VS:  There were no vitals taken for this visit.    Wt Readings from Last 3 Encounters:  11/24/20 207 lb 12.8 oz (94.3 kg)  11/10/20 206 lb 12.8 oz (93.8 kg)  10/04/20 206 lb 12.8 oz (93.8 kg)     GEN: Appears younger than his age well nourished, well developed in no acute distress HEENT: Normal NECK: No JVD; No carotid bruits LYMPHATICS: No lymphadenopathy CARDIAC: RRR, no murmurs, rubs, gallops RESPIRATORY:  Clear to auscultation without rales, wheezing or rhonchi  ABDOMEN: Soft, non-tender, non-distended MUSCULOSKELETAL:  No edema; No deformity  SKIN: Warm and dry NEUROLOGIC:  Alert and oriented x 3 PSYCHIATRIC:  Normal affect    Signed, Norman Herrlich, MD  12/28/2020 11:47 AM    [facility]

## 2020-12-28 ENCOUNTER — Ambulatory Visit: Payer: Medicare Other | Admitting: Cardiology

## 2020-12-28 ENCOUNTER — Encounter: Payer: Self-pay | Admitting: Cardiology

## 2020-12-28 ENCOUNTER — Other Ambulatory Visit: Payer: Self-pay

## 2020-12-28 VITALS — BP 130/80 | HR 66 | Ht 72.0 in | Wt 209.0 lb

## 2020-12-28 DIAGNOSIS — E782 Mixed hyperlipidemia: Secondary | ICD-10-CM

## 2020-12-28 DIAGNOSIS — I2510 Atherosclerotic heart disease of native coronary artery without angina pectoris: Secondary | ICD-10-CM | POA: Diagnosis not present

## 2020-12-28 DIAGNOSIS — R931 Abnormal findings on diagnostic imaging of heart and coronary circulation: Secondary | ICD-10-CM | POA: Diagnosis not present

## 2020-12-28 DIAGNOSIS — I10 Essential (primary) hypertension: Secondary | ICD-10-CM

## 2020-12-28 DIAGNOSIS — I2589 Other forms of chronic ischemic heart disease: Secondary | ICD-10-CM | POA: Diagnosis not present

## 2020-12-28 DIAGNOSIS — I4510 Unspecified right bundle-branch block: Secondary | ICD-10-CM | POA: Diagnosis not present

## 2020-12-28 MED ORDER — RANOLAZINE ER 500 MG PO TB12: 1000.0000 mg | ORAL_TABLET | Freq: Two times a day (BID) | ORAL | 3 refills | Status: DC

## 2020-12-28 NOTE — Patient Instructions (Signed)
Medication Instructions:  Your physician has recommended you make the following change in your medication:  START: Ranolazine 500 mg take 1 tablet by mouth twice daily for two weeks. Then increase to two tablets by mouth twice daily.  *If you need a refill on your cardiac medications before your next appointment, please call your pharmacy*   Lab Work: None If you have labs (blood work) drawn today and your tests are completely normal, you will receive your results only by: MyChart Message (if you have MyChart) OR A paper copy in the mail If you have any lab test that is abnormal or we need to change your treatment, we will call you to review the results.   Testing/Procedures: None   Follow-Up: At [facility], you and your health needs are our priority.  As part of our continuing mission to provide you with exceptional heart care, we have created designated Provider Care Teams.  These Care Teams include your primary Cardiologist (physician) and Advanced Practice Providers (APPs -  Physician Assistants and Nurse Practitioners) who all work together to provide you with the care you need, when you need it.  We recommend signing up for the patient portal called "MyChart".  Sign up information is provided on this After Visit Summary.  MyChart is used to connect with patients for Virtual Visits (Telemedicine).  Patients are able to view lab/test results, encounter notes, upcoming appointments, etc.  Non-urgent messages can be sent to your provider as well.   To learn more about what you can do with MyChart, go to [email or web address].    Your next appointment:   6 week(s)  The format for your next appointment:   In Person  Provider:   Norman Herrlich, MD    Other Instructions

## 2021-01-04 ENCOUNTER — Telehealth: Payer: Self-pay

## 2021-01-04 DIAGNOSIS — E1169 Type 2 diabetes mellitus with other specified complication: Secondary | ICD-10-CM | POA: Diagnosis not present

## 2021-01-04 DIAGNOSIS — E039 Hypothyroidism, unspecified: Secondary | ICD-10-CM | POA: Diagnosis not present

## 2021-01-04 DIAGNOSIS — E782 Mixed hyperlipidemia: Secondary | ICD-10-CM | POA: Diagnosis not present

## 2021-01-04 NOTE — Telephone Encounter (Signed)
Prior authorization started for [employer] Key: [id number]

## 2021-01-04 NOTE — Telephone Encounter (Signed)
Correction prior authorization is for Ranolazine ER 500 mg ER  Key: [id number]

## 2021-01-04 NOTE — Telephone Encounter (Signed)
New prior authorization for Ranolazine ER  / KEY : Key: [id number]

## 2021-01-05 NOTE — Telephone Encounter (Signed)
AVID Mattos Key: [id number] help? Call us at [phone] Outcome Denied on November 30 Your request has been denied

## 2021-01-06 ENCOUNTER — Telehealth: Payer: Self-pay | Admitting: Cardiology

## 2021-01-06 NOTE — Telephone Encounter (Signed)
Left voicemail to return call, regarding prior authorization for Ranolazine .     I have sent a second prior auth for Ranolazine Key :  Key: [id number]

## 2021-01-06 NOTE — Telephone Encounter (Signed)
Pt is returning call to triage °

## 2021-01-09 ENCOUNTER — Telehealth: Payer: Self-pay | Admitting: Cardiology

## 2021-01-09 NOTE — Telephone Encounter (Signed)
Pt c/o medication issue:  1. Name of Medication: ranolazine (RANEXA) 500 MG 12 hr tablet  2. How are you currently taking this medication (dosage and times per day)? Patient has not started taking the medication yet   3. Are you having a reaction (difficulty breathing--STAT)?   4. What is your medication issue? Patient has not started the medicine yet.   The patient said his insurance company would not approve the 500 mg tablet to take a total of 4 a day. The insurance company did say that there is a 1000 mg tablet that they would approve so the patient only had to take 2 tablets total daily.  Since the patient has not started taking the medication yet he had to cancel his nurse visit for 01/11/21.  The patient needs to know what he needs to do

## 2021-01-09 NOTE — Telephone Encounter (Signed)
Voice message left for patient regarding the Ranolazine prior authorization has been denied. A new coverage determination cannot be requested until day 61 following the previous denial.  We will send an appeal for the denial as well.  Instructed patient to call back if he had any questions.

## 2021-01-09 NOTE — Telephone Encounter (Signed)
Left message on patients voicemail to please return our call.   

## 2021-01-10 ENCOUNTER — Other Ambulatory Visit: Payer: Self-pay

## 2021-01-10 DIAGNOSIS — Z6826 Body mass index (BMI) 26.0-26.9, adult: Secondary | ICD-10-CM | POA: Diagnosis not present

## 2021-01-10 DIAGNOSIS — G4710 Hypersomnia, unspecified: Secondary | ICD-10-CM | POA: Diagnosis not present

## 2021-01-10 MED ORDER — RANOLAZINE ER 1000 MG PO PACK: 1000.0000 mg | PACK | Freq: Two times a day (BID) | ORAL | 3 refills | Status: AC

## 2021-01-10 NOTE — Telephone Encounter (Signed)
Spoke to the patient just now and let him know that we are in the process of doing the appeal for this medication now as they would not let us submit another prior authorization. Misty Stanley, CMA is working on this and we will let him know what the results of the appeal are.    Encouraged patient to call back with any questions or concerns.

## 2021-01-11 ENCOUNTER — Ambulatory Visit: Payer: Medicare Other

## 2021-01-12 ENCOUNTER — Telehealth: Payer: Self-pay | Admitting: Cardiology

## 2021-01-12 NOTE — Telephone Encounter (Signed)
Left message on patients voicemail to please return our call.   

## 2021-01-12 NOTE — Telephone Encounter (Signed)
Rocky Link from [employer] was calling about a prior auth for this patient's Ranolazine ER 1000 MG PACK

## 2021-01-12 NOTE — Telephone Encounter (Signed)
Spoke to the patient just now and he let me know that he was able to pick it up and said that the medication is helping already.   Called Rocky Link back and he was just wanting to make sure that we no longer wanted to do the appeal for the 500 mg tablets. I advised that we did not need that any longer and he verbalized understanding.

## 2021-01-16 ENCOUNTER — Telehealth: Payer: Self-pay | Admitting: Cardiology

## 2021-01-16 NOTE — Telephone Encounter (Signed)
Pt c/o medication issue:  1. Name of Medication: Ranolazine ER 1000 MG PACK  2. How are you currently taking this medication (dosage and times per day)? Take 1,000 mg by mouth 2 (two) times daily.  3. Are you having a reaction (difficulty breathing--STAT)? No   4. What is your medication issue? Patient was experiencing constipation and severe nausea while taking medication. Stopped taking it on Saturday.

## 2021-01-24 DIAGNOSIS — R0602 Shortness of breath: Secondary | ICD-10-CM | POA: Diagnosis not present

## 2021-01-24 DIAGNOSIS — G4733 Obstructive sleep apnea (adult) (pediatric): Secondary | ICD-10-CM | POA: Diagnosis not present

## 2021-01-25 DIAGNOSIS — R0602 Shortness of breath: Secondary | ICD-10-CM | POA: Diagnosis not present

## 2021-01-25 DIAGNOSIS — G4733 Obstructive sleep apnea (adult) (pediatric): Secondary | ICD-10-CM | POA: Diagnosis not present

## 2021-02-03 DIAGNOSIS — E1169 Type 2 diabetes mellitus with other specified complication: Secondary | ICD-10-CM | POA: Diagnosis not present

## 2021-02-03 DIAGNOSIS — E782 Mixed hyperlipidemia: Secondary | ICD-10-CM | POA: Diagnosis not present

## 2021-02-03 DIAGNOSIS — E039 Hypothyroidism, unspecified: Secondary | ICD-10-CM | POA: Diagnosis not present

## 2021-02-13 ENCOUNTER — Ambulatory Visit: Payer: Medicare Other | Admitting: Cardiology

## 2021-03-07 DIAGNOSIS — E1169 Type 2 diabetes mellitus with other specified complication: Secondary | ICD-10-CM | POA: Diagnosis not present

## 2021-03-07 DIAGNOSIS — E782 Mixed hyperlipidemia: Secondary | ICD-10-CM | POA: Diagnosis not present

## 2021-03-07 DIAGNOSIS — E039 Hypothyroidism, unspecified: Secondary | ICD-10-CM | POA: Diagnosis not present

## 2021-03-23 DIAGNOSIS — E1165 Type 2 diabetes mellitus with hyperglycemia: Secondary | ICD-10-CM | POA: Diagnosis not present

## 2021-03-23 DIAGNOSIS — R509 Fever, unspecified: Secondary | ICD-10-CM | POA: Diagnosis not present

## 2021-03-23 DIAGNOSIS — R6883 Chills (without fever): Secondary | ICD-10-CM | POA: Diagnosis not present

## 2021-03-23 DIAGNOSIS — E785 Hyperlipidemia, unspecified: Secondary | ICD-10-CM | POA: Diagnosis not present

## 2021-03-23 DIAGNOSIS — R29898 Other symptoms and signs involving the musculoskeletal system: Secondary | ICD-10-CM | POA: Diagnosis not present

## 2021-04-04 DIAGNOSIS — G4733 Obstructive sleep apnea (adult) (pediatric): Secondary | ICD-10-CM | POA: Diagnosis not present

## 2021-04-12 DIAGNOSIS — R700 Elevated erythrocyte sedimentation rate: Secondary | ICD-10-CM | POA: Diagnosis not present

## 2021-04-17 DIAGNOSIS — G4733 Obstructive sleep apnea (adult) (pediatric): Secondary | ICD-10-CM | POA: Diagnosis not present

## 2021-04-17 DIAGNOSIS — R700 Elevated erythrocyte sedimentation rate: Secondary | ICD-10-CM | POA: Diagnosis not present

## 2021-04-17 DIAGNOSIS — Z6826 Body mass index (BMI) 26.0-26.9, adult: Secondary | ICD-10-CM | POA: Diagnosis not present

## 2021-05-02 DIAGNOSIS — G4733 Obstructive sleep apnea (adult) (pediatric): Secondary | ICD-10-CM | POA: Diagnosis not present

## 2021-05-17 DIAGNOSIS — D510 Vitamin B12 deficiency anemia due to intrinsic factor deficiency: Secondary | ICD-10-CM | POA: Diagnosis not present

## 2021-05-29 DIAGNOSIS — G4733 Obstructive sleep apnea (adult) (pediatric): Secondary | ICD-10-CM | POA: Diagnosis not present

## 2021-06-19 DIAGNOSIS — D510 Vitamin B12 deficiency anemia due to intrinsic factor deficiency: Secondary | ICD-10-CM | POA: Diagnosis not present

## 2021-06-20 DIAGNOSIS — M7752 Other enthesopathy of left foot: Secondary | ICD-10-CM | POA: Diagnosis not present

## 2021-06-20 DIAGNOSIS — Z7984 Long term (current) use of oral hypoglycemic drugs: Secondary | ICD-10-CM | POA: Diagnosis not present

## 2021-06-20 DIAGNOSIS — E1142 Type 2 diabetes mellitus with diabetic polyneuropathy: Secondary | ICD-10-CM | POA: Diagnosis not present

## 2021-07-21 DIAGNOSIS — D510 Vitamin B12 deficiency anemia due to intrinsic factor deficiency: Secondary | ICD-10-CM | POA: Diagnosis not present

## 2021-08-22 DIAGNOSIS — D510 Vitamin B12 deficiency anemia due to intrinsic factor deficiency: Secondary | ICD-10-CM | POA: Diagnosis not present

## 2021-08-23 DIAGNOSIS — E782 Mixed hyperlipidemia: Secondary | ICD-10-CM | POA: Diagnosis not present

## 2021-08-23 DIAGNOSIS — E1165 Type 2 diabetes mellitus with hyperglycemia: Secondary | ICD-10-CM | POA: Diagnosis not present

## 2021-08-23 DIAGNOSIS — Z79899 Other long term (current) drug therapy: Secondary | ICD-10-CM | POA: Diagnosis not present

## 2021-08-25 DIAGNOSIS — E1169 Type 2 diabetes mellitus with other specified complication: Secondary | ICD-10-CM | POA: Diagnosis not present

## 2021-08-25 DIAGNOSIS — E785 Hyperlipidemia, unspecified: Secondary | ICD-10-CM | POA: Diagnosis not present

## 2021-08-25 DIAGNOSIS — R7989 Other specified abnormal findings of blood chemistry: Secondary | ICD-10-CM | POA: Diagnosis not present

## 2021-08-25 DIAGNOSIS — E782 Mixed hyperlipidemia: Secondary | ICD-10-CM | POA: Diagnosis not present

## 2021-09-04 DIAGNOSIS — E039 Hypothyroidism, unspecified: Secondary | ICD-10-CM | POA: Diagnosis not present

## 2021-09-04 DIAGNOSIS — E1169 Type 2 diabetes mellitus with other specified complication: Secondary | ICD-10-CM | POA: Diagnosis not present

## 2021-09-04 DIAGNOSIS — E782 Mixed hyperlipidemia: Secondary | ICD-10-CM | POA: Diagnosis not present

## 2021-09-12 DIAGNOSIS — E291 Testicular hypofunction: Secondary | ICD-10-CM | POA: Diagnosis not present

## 2021-10-04 DIAGNOSIS — D510 Vitamin B12 deficiency anemia due to intrinsic factor deficiency: Secondary | ICD-10-CM | POA: Diagnosis not present

## 2021-10-31 DIAGNOSIS — E039 Hypothyroidism, unspecified: Secondary | ICD-10-CM | POA: Diagnosis not present

## 2021-10-31 DIAGNOSIS — N400 Enlarged prostate without lower urinary tract symptoms: Secondary | ICD-10-CM | POA: Diagnosis not present

## 2021-11-07 DIAGNOSIS — E1169 Type 2 diabetes mellitus with other specified complication: Secondary | ICD-10-CM | POA: Diagnosis not present

## 2021-11-07 DIAGNOSIS — E785 Hyperlipidemia, unspecified: Secondary | ICD-10-CM | POA: Diagnosis not present

## 2021-11-07 DIAGNOSIS — N400 Enlarged prostate without lower urinary tract symptoms: Secondary | ICD-10-CM | POA: Diagnosis not present

## 2021-11-07 DIAGNOSIS — E782 Mixed hyperlipidemia: Secondary | ICD-10-CM | POA: Diagnosis not present

## 2021-11-14 DIAGNOSIS — L821 Other seborrheic keratosis: Secondary | ICD-10-CM | POA: Diagnosis not present

## 2021-11-14 DIAGNOSIS — L578 Other skin changes due to chronic exposure to nonionizing radiation: Secondary | ICD-10-CM | POA: Diagnosis not present

## 2021-11-14 DIAGNOSIS — L570 Actinic keratosis: Secondary | ICD-10-CM | POA: Diagnosis not present

## 2021-12-05 DIAGNOSIS — E119 Type 2 diabetes mellitus without complications: Secondary | ICD-10-CM | POA: Diagnosis not present

## 2021-12-08 DIAGNOSIS — G4733 Obstructive sleep apnea (adult) (pediatric): Secondary | ICD-10-CM | POA: Diagnosis not present

## 2022-01-09 DIAGNOSIS — E538 Deficiency of other specified B group vitamins: Secondary | ICD-10-CM | POA: Diagnosis not present

## 2022-02-19 DIAGNOSIS — E538 Deficiency of other specified B group vitamins: Secondary | ICD-10-CM | POA: Diagnosis not present

## 2022-03-10 DIAGNOSIS — G4733 Obstructive sleep apnea (adult) (pediatric): Secondary | ICD-10-CM | POA: Diagnosis not present

## 2022-03-27 DIAGNOSIS — D519 Vitamin B12 deficiency anemia, unspecified: Secondary | ICD-10-CM | POA: Diagnosis not present

## 2022-05-10 DIAGNOSIS — E1169 Type 2 diabetes mellitus with other specified complication: Secondary | ICD-10-CM | POA: Diagnosis not present

## 2022-05-10 DIAGNOSIS — D510 Vitamin B12 deficiency anemia due to intrinsic factor deficiency: Secondary | ICD-10-CM | POA: Diagnosis not present

## 2022-05-10 DIAGNOSIS — E782 Mixed hyperlipidemia: Secondary | ICD-10-CM | POA: Diagnosis not present

## 2022-05-10 DIAGNOSIS — E785 Hyperlipidemia, unspecified: Secondary | ICD-10-CM | POA: Diagnosis not present

## 2022-05-17 DIAGNOSIS — G720 Drug-induced myopathy: Secondary | ICD-10-CM | POA: Diagnosis not present

## 2022-05-17 DIAGNOSIS — E1169 Type 2 diabetes mellitus with other specified complication: Secondary | ICD-10-CM | POA: Diagnosis not present

## 2022-05-17 DIAGNOSIS — E782 Mixed hyperlipidemia: Secondary | ICD-10-CM | POA: Diagnosis not present

## 2022-05-17 DIAGNOSIS — E538 Deficiency of other specified B group vitamins: Secondary | ICD-10-CM | POA: Diagnosis not present

## 2022-05-17 DIAGNOSIS — E785 Hyperlipidemia, unspecified: Secondary | ICD-10-CM | POA: Diagnosis not present

## 2022-06-08 DIAGNOSIS — G4733 Obstructive sleep apnea (adult) (pediatric): Secondary | ICD-10-CM | POA: Diagnosis not present

## 2022-06-15 DIAGNOSIS — M9901 Segmental and somatic dysfunction of cervical region: Secondary | ICD-10-CM | POA: Diagnosis not present

## 2022-06-15 DIAGNOSIS — M9903 Segmental and somatic dysfunction of lumbar region: Secondary | ICD-10-CM | POA: Diagnosis not present

## 2022-06-15 DIAGNOSIS — M9905 Segmental and somatic dysfunction of pelvic region: Secondary | ICD-10-CM | POA: Diagnosis not present

## 2022-06-15 DIAGNOSIS — M9902 Segmental and somatic dysfunction of thoracic region: Secondary | ICD-10-CM | POA: Diagnosis not present

## 2022-06-18 DIAGNOSIS — M9905 Segmental and somatic dysfunction of pelvic region: Secondary | ICD-10-CM | POA: Diagnosis not present

## 2022-06-18 DIAGNOSIS — M9901 Segmental and somatic dysfunction of cervical region: Secondary | ICD-10-CM | POA: Diagnosis not present

## 2022-06-18 DIAGNOSIS — M9903 Segmental and somatic dysfunction of lumbar region: Secondary | ICD-10-CM | POA: Diagnosis not present

## 2022-06-18 DIAGNOSIS — M9902 Segmental and somatic dysfunction of thoracic region: Secondary | ICD-10-CM | POA: Diagnosis not present

## 2022-06-19 DIAGNOSIS — M9903 Segmental and somatic dysfunction of lumbar region: Secondary | ICD-10-CM | POA: Diagnosis not present

## 2022-06-19 DIAGNOSIS — M9905 Segmental and somatic dysfunction of pelvic region: Secondary | ICD-10-CM | POA: Diagnosis not present

## 2022-06-19 DIAGNOSIS — M9902 Segmental and somatic dysfunction of thoracic region: Secondary | ICD-10-CM | POA: Diagnosis not present

## 2022-06-19 DIAGNOSIS — M9901 Segmental and somatic dysfunction of cervical region: Secondary | ICD-10-CM | POA: Diagnosis not present

## 2022-06-21 DIAGNOSIS — M9901 Segmental and somatic dysfunction of cervical region: Secondary | ICD-10-CM | POA: Diagnosis not present

## 2022-06-21 DIAGNOSIS — M9905 Segmental and somatic dysfunction of pelvic region: Secondary | ICD-10-CM | POA: Diagnosis not present

## 2022-06-21 DIAGNOSIS — M9903 Segmental and somatic dysfunction of lumbar region: Secondary | ICD-10-CM | POA: Diagnosis not present

## 2022-06-21 DIAGNOSIS — M9902 Segmental and somatic dysfunction of thoracic region: Secondary | ICD-10-CM | POA: Diagnosis not present

## 2022-06-25 DIAGNOSIS — M9901 Segmental and somatic dysfunction of cervical region: Secondary | ICD-10-CM | POA: Diagnosis not present

## 2022-06-25 DIAGNOSIS — M9902 Segmental and somatic dysfunction of thoracic region: Secondary | ICD-10-CM | POA: Diagnosis not present

## 2022-06-25 DIAGNOSIS — M9903 Segmental and somatic dysfunction of lumbar region: Secondary | ICD-10-CM | POA: Diagnosis not present

## 2022-06-25 DIAGNOSIS — M9905 Segmental and somatic dysfunction of pelvic region: Secondary | ICD-10-CM | POA: Diagnosis not present

## 2022-06-26 DIAGNOSIS — M9901 Segmental and somatic dysfunction of cervical region: Secondary | ICD-10-CM | POA: Diagnosis not present

## 2022-06-26 DIAGNOSIS — M9905 Segmental and somatic dysfunction of pelvic region: Secondary | ICD-10-CM | POA: Diagnosis not present

## 2022-06-26 DIAGNOSIS — M9902 Segmental and somatic dysfunction of thoracic region: Secondary | ICD-10-CM | POA: Diagnosis not present

## 2022-06-26 DIAGNOSIS — M9903 Segmental and somatic dysfunction of lumbar region: Secondary | ICD-10-CM | POA: Diagnosis not present

## 2022-06-28 DIAGNOSIS — M9902 Segmental and somatic dysfunction of thoracic region: Secondary | ICD-10-CM | POA: Diagnosis not present

## 2022-06-28 DIAGNOSIS — M9903 Segmental and somatic dysfunction of lumbar region: Secondary | ICD-10-CM | POA: Diagnosis not present

## 2022-06-28 DIAGNOSIS — M9901 Segmental and somatic dysfunction of cervical region: Secondary | ICD-10-CM | POA: Diagnosis not present

## 2022-06-28 DIAGNOSIS — M9905 Segmental and somatic dysfunction of pelvic region: Secondary | ICD-10-CM | POA: Diagnosis not present

## 2022-07-03 DIAGNOSIS — M9903 Segmental and somatic dysfunction of lumbar region: Secondary | ICD-10-CM | POA: Diagnosis not present

## 2022-07-03 DIAGNOSIS — M9902 Segmental and somatic dysfunction of thoracic region: Secondary | ICD-10-CM | POA: Diagnosis not present

## 2022-07-03 DIAGNOSIS — M9901 Segmental and somatic dysfunction of cervical region: Secondary | ICD-10-CM | POA: Diagnosis not present

## 2022-07-03 DIAGNOSIS — M9905 Segmental and somatic dysfunction of pelvic region: Secondary | ICD-10-CM | POA: Diagnosis not present

## 2022-07-04 DIAGNOSIS — M9901 Segmental and somatic dysfunction of cervical region: Secondary | ICD-10-CM | POA: Diagnosis not present

## 2022-07-04 DIAGNOSIS — M9905 Segmental and somatic dysfunction of pelvic region: Secondary | ICD-10-CM | POA: Diagnosis not present

## 2022-07-04 DIAGNOSIS — M9903 Segmental and somatic dysfunction of lumbar region: Secondary | ICD-10-CM | POA: Diagnosis not present

## 2022-07-04 DIAGNOSIS — M9902 Segmental and somatic dysfunction of thoracic region: Secondary | ICD-10-CM | POA: Diagnosis not present

## 2022-07-05 DIAGNOSIS — M9901 Segmental and somatic dysfunction of cervical region: Secondary | ICD-10-CM | POA: Diagnosis not present

## 2022-07-05 DIAGNOSIS — M9905 Segmental and somatic dysfunction of pelvic region: Secondary | ICD-10-CM | POA: Diagnosis not present

## 2022-07-05 DIAGNOSIS — M9903 Segmental and somatic dysfunction of lumbar region: Secondary | ICD-10-CM | POA: Diagnosis not present

## 2022-07-05 DIAGNOSIS — M9902 Segmental and somatic dysfunction of thoracic region: Secondary | ICD-10-CM | POA: Diagnosis not present

## 2022-07-09 DIAGNOSIS — M9903 Segmental and somatic dysfunction of lumbar region: Secondary | ICD-10-CM | POA: Diagnosis not present

## 2022-07-09 DIAGNOSIS — M9902 Segmental and somatic dysfunction of thoracic region: Secondary | ICD-10-CM | POA: Diagnosis not present

## 2022-07-09 DIAGNOSIS — M9901 Segmental and somatic dysfunction of cervical region: Secondary | ICD-10-CM | POA: Diagnosis not present

## 2022-07-09 DIAGNOSIS — M9905 Segmental and somatic dysfunction of pelvic region: Secondary | ICD-10-CM | POA: Diagnosis not present

## 2022-07-10 DIAGNOSIS — M9902 Segmental and somatic dysfunction of thoracic region: Secondary | ICD-10-CM | POA: Diagnosis not present

## 2022-07-10 DIAGNOSIS — M9901 Segmental and somatic dysfunction of cervical region: Secondary | ICD-10-CM | POA: Diagnosis not present

## 2022-07-10 DIAGNOSIS — M9903 Segmental and somatic dysfunction of lumbar region: Secondary | ICD-10-CM | POA: Diagnosis not present

## 2022-07-10 DIAGNOSIS — M9905 Segmental and somatic dysfunction of pelvic region: Secondary | ICD-10-CM | POA: Diagnosis not present

## 2022-07-16 DIAGNOSIS — M9903 Segmental and somatic dysfunction of lumbar region: Secondary | ICD-10-CM | POA: Diagnosis not present

## 2022-07-16 DIAGNOSIS — M9902 Segmental and somatic dysfunction of thoracic region: Secondary | ICD-10-CM | POA: Diagnosis not present

## 2022-07-16 DIAGNOSIS — M9901 Segmental and somatic dysfunction of cervical region: Secondary | ICD-10-CM | POA: Diagnosis not present

## 2022-07-16 DIAGNOSIS — M9905 Segmental and somatic dysfunction of pelvic region: Secondary | ICD-10-CM | POA: Diagnosis not present

## 2022-07-17 DIAGNOSIS — M9903 Segmental and somatic dysfunction of lumbar region: Secondary | ICD-10-CM | POA: Diagnosis not present

## 2022-07-17 DIAGNOSIS — M9901 Segmental and somatic dysfunction of cervical region: Secondary | ICD-10-CM | POA: Diagnosis not present

## 2022-07-17 DIAGNOSIS — M9905 Segmental and somatic dysfunction of pelvic region: Secondary | ICD-10-CM | POA: Diagnosis not present

## 2022-07-17 DIAGNOSIS — M9902 Segmental and somatic dysfunction of thoracic region: Secondary | ICD-10-CM | POA: Diagnosis not present

## 2022-07-19 DIAGNOSIS — M9902 Segmental and somatic dysfunction of thoracic region: Secondary | ICD-10-CM | POA: Diagnosis not present

## 2022-07-19 DIAGNOSIS — M9903 Segmental and somatic dysfunction of lumbar region: Secondary | ICD-10-CM | POA: Diagnosis not present

## 2022-07-19 DIAGNOSIS — M9901 Segmental and somatic dysfunction of cervical region: Secondary | ICD-10-CM | POA: Diagnosis not present

## 2022-07-19 DIAGNOSIS — M9905 Segmental and somatic dysfunction of pelvic region: Secondary | ICD-10-CM | POA: Diagnosis not present

## 2022-07-23 DIAGNOSIS — M9901 Segmental and somatic dysfunction of cervical region: Secondary | ICD-10-CM | POA: Diagnosis not present

## 2022-07-23 DIAGNOSIS — E538 Deficiency of other specified B group vitamins: Secondary | ICD-10-CM | POA: Diagnosis not present

## 2022-07-23 DIAGNOSIS — M9902 Segmental and somatic dysfunction of thoracic region: Secondary | ICD-10-CM | POA: Diagnosis not present

## 2022-07-23 DIAGNOSIS — M9903 Segmental and somatic dysfunction of lumbar region: Secondary | ICD-10-CM | POA: Diagnosis not present

## 2022-07-23 DIAGNOSIS — M9905 Segmental and somatic dysfunction of pelvic region: Secondary | ICD-10-CM | POA: Diagnosis not present

## 2022-07-26 DIAGNOSIS — M9903 Segmental and somatic dysfunction of lumbar region: Secondary | ICD-10-CM | POA: Diagnosis not present

## 2022-07-26 DIAGNOSIS — M9905 Segmental and somatic dysfunction of pelvic region: Secondary | ICD-10-CM | POA: Diagnosis not present

## 2022-07-26 DIAGNOSIS — M9902 Segmental and somatic dysfunction of thoracic region: Secondary | ICD-10-CM | POA: Diagnosis not present

## 2022-07-26 DIAGNOSIS — M9901 Segmental and somatic dysfunction of cervical region: Secondary | ICD-10-CM | POA: Diagnosis not present

## 2022-07-30 DIAGNOSIS — M9905 Segmental and somatic dysfunction of pelvic region: Secondary | ICD-10-CM | POA: Diagnosis not present

## 2022-07-30 DIAGNOSIS — M9902 Segmental and somatic dysfunction of thoracic region: Secondary | ICD-10-CM | POA: Diagnosis not present

## 2022-07-30 DIAGNOSIS — M9901 Segmental and somatic dysfunction of cervical region: Secondary | ICD-10-CM | POA: Diagnosis not present

## 2022-07-30 DIAGNOSIS — M9903 Segmental and somatic dysfunction of lumbar region: Secondary | ICD-10-CM | POA: Diagnosis not present

## 2022-08-01 DIAGNOSIS — Z6827 Body mass index (BMI) 27.0-27.9, adult: Secondary | ICD-10-CM | POA: Diagnosis not present

## 2022-08-01 DIAGNOSIS — B356 Tinea cruris: Secondary | ICD-10-CM | POA: Diagnosis not present

## 2022-08-01 DIAGNOSIS — E1165 Type 2 diabetes mellitus with hyperglycemia: Secondary | ICD-10-CM | POA: Diagnosis not present

## 2022-08-02 DIAGNOSIS — M9901 Segmental and somatic dysfunction of cervical region: Secondary | ICD-10-CM | POA: Diagnosis not present

## 2022-08-02 DIAGNOSIS — M9905 Segmental and somatic dysfunction of pelvic region: Secondary | ICD-10-CM | POA: Diagnosis not present

## 2022-08-02 DIAGNOSIS — M9902 Segmental and somatic dysfunction of thoracic region: Secondary | ICD-10-CM | POA: Diagnosis not present

## 2022-08-02 DIAGNOSIS — M9903 Segmental and somatic dysfunction of lumbar region: Secondary | ICD-10-CM | POA: Diagnosis not present

## 2022-08-06 DIAGNOSIS — M9902 Segmental and somatic dysfunction of thoracic region: Secondary | ICD-10-CM | POA: Diagnosis not present

## 2022-08-06 DIAGNOSIS — M9901 Segmental and somatic dysfunction of cervical region: Secondary | ICD-10-CM | POA: Diagnosis not present

## 2022-08-06 DIAGNOSIS — M9903 Segmental and somatic dysfunction of lumbar region: Secondary | ICD-10-CM | POA: Diagnosis not present

## 2022-08-06 DIAGNOSIS — M9905 Segmental and somatic dysfunction of pelvic region: Secondary | ICD-10-CM | POA: Diagnosis not present

## 2022-08-07 DIAGNOSIS — M9905 Segmental and somatic dysfunction of pelvic region: Secondary | ICD-10-CM | POA: Diagnosis not present

## 2022-08-07 DIAGNOSIS — M9901 Segmental and somatic dysfunction of cervical region: Secondary | ICD-10-CM | POA: Diagnosis not present

## 2022-08-07 DIAGNOSIS — M9903 Segmental and somatic dysfunction of lumbar region: Secondary | ICD-10-CM | POA: Diagnosis not present

## 2022-08-07 DIAGNOSIS — M9902 Segmental and somatic dysfunction of thoracic region: Secondary | ICD-10-CM | POA: Diagnosis not present

## 2022-08-13 DIAGNOSIS — M9905 Segmental and somatic dysfunction of pelvic region: Secondary | ICD-10-CM | POA: Diagnosis not present

## 2022-08-13 DIAGNOSIS — M9903 Segmental and somatic dysfunction of lumbar region: Secondary | ICD-10-CM | POA: Diagnosis not present

## 2022-08-13 DIAGNOSIS — M9902 Segmental and somatic dysfunction of thoracic region: Secondary | ICD-10-CM | POA: Diagnosis not present

## 2022-08-13 DIAGNOSIS — M9901 Segmental and somatic dysfunction of cervical region: Secondary | ICD-10-CM | POA: Diagnosis not present

## 2022-08-20 DIAGNOSIS — M9905 Segmental and somatic dysfunction of pelvic region: Secondary | ICD-10-CM | POA: Diagnosis not present

## 2022-08-20 DIAGNOSIS — M9901 Segmental and somatic dysfunction of cervical region: Secondary | ICD-10-CM | POA: Diagnosis not present

## 2022-08-20 DIAGNOSIS — M9903 Segmental and somatic dysfunction of lumbar region: Secondary | ICD-10-CM | POA: Diagnosis not present

## 2022-08-20 DIAGNOSIS — M9902 Segmental and somatic dysfunction of thoracic region: Secondary | ICD-10-CM | POA: Diagnosis not present

## 2022-08-23 DIAGNOSIS — M9901 Segmental and somatic dysfunction of cervical region: Secondary | ICD-10-CM | POA: Diagnosis not present

## 2022-08-23 DIAGNOSIS — M9905 Segmental and somatic dysfunction of pelvic region: Secondary | ICD-10-CM | POA: Diagnosis not present

## 2022-08-23 DIAGNOSIS — M9902 Segmental and somatic dysfunction of thoracic region: Secondary | ICD-10-CM | POA: Diagnosis not present

## 2022-08-23 DIAGNOSIS — M9903 Segmental and somatic dysfunction of lumbar region: Secondary | ICD-10-CM | POA: Diagnosis not present

## 2022-08-27 DIAGNOSIS — M9903 Segmental and somatic dysfunction of lumbar region: Secondary | ICD-10-CM | POA: Diagnosis not present

## 2022-08-27 DIAGNOSIS — M9905 Segmental and somatic dysfunction of pelvic region: Secondary | ICD-10-CM | POA: Diagnosis not present

## 2022-08-27 DIAGNOSIS — M9902 Segmental and somatic dysfunction of thoracic region: Secondary | ICD-10-CM | POA: Diagnosis not present

## 2022-08-27 DIAGNOSIS — D519 Vitamin B12 deficiency anemia, unspecified: Secondary | ICD-10-CM | POA: Diagnosis not present

## 2022-08-27 DIAGNOSIS — M9901 Segmental and somatic dysfunction of cervical region: Secondary | ICD-10-CM | POA: Diagnosis not present

## 2022-08-30 DIAGNOSIS — M9901 Segmental and somatic dysfunction of cervical region: Secondary | ICD-10-CM | POA: Diagnosis not present

## 2022-08-30 DIAGNOSIS — M9905 Segmental and somatic dysfunction of pelvic region: Secondary | ICD-10-CM | POA: Diagnosis not present

## 2022-08-30 DIAGNOSIS — M9903 Segmental and somatic dysfunction of lumbar region: Secondary | ICD-10-CM | POA: Diagnosis not present

## 2022-08-30 DIAGNOSIS — M9902 Segmental and somatic dysfunction of thoracic region: Secondary | ICD-10-CM | POA: Diagnosis not present

## 2022-09-03 DIAGNOSIS — M9902 Segmental and somatic dysfunction of thoracic region: Secondary | ICD-10-CM | POA: Diagnosis not present

## 2022-09-03 DIAGNOSIS — Z6826 Body mass index (BMI) 26.0-26.9, adult: Secondary | ICD-10-CM | POA: Diagnosis not present

## 2022-09-03 DIAGNOSIS — E785 Hyperlipidemia, unspecified: Secondary | ICD-10-CM | POA: Diagnosis not present

## 2022-09-03 DIAGNOSIS — M9903 Segmental and somatic dysfunction of lumbar region: Secondary | ICD-10-CM | POA: Diagnosis not present

## 2022-09-03 DIAGNOSIS — M9905 Segmental and somatic dysfunction of pelvic region: Secondary | ICD-10-CM | POA: Diagnosis not present

## 2022-09-03 DIAGNOSIS — M9901 Segmental and somatic dysfunction of cervical region: Secondary | ICD-10-CM | POA: Diagnosis not present

## 2022-09-03 DIAGNOSIS — E1169 Type 2 diabetes mellitus with other specified complication: Secondary | ICD-10-CM | POA: Diagnosis not present

## 2022-09-09 DIAGNOSIS — G4733 Obstructive sleep apnea (adult) (pediatric): Secondary | ICD-10-CM | POA: Diagnosis not present

## 2022-09-13 DIAGNOSIS — M9902 Segmental and somatic dysfunction of thoracic region: Secondary | ICD-10-CM | POA: Diagnosis not present

## 2022-09-13 DIAGNOSIS — M9903 Segmental and somatic dysfunction of lumbar region: Secondary | ICD-10-CM | POA: Diagnosis not present

## 2022-09-13 DIAGNOSIS — M9901 Segmental and somatic dysfunction of cervical region: Secondary | ICD-10-CM | POA: Diagnosis not present

## 2022-09-13 DIAGNOSIS — M9905 Segmental and somatic dysfunction of pelvic region: Secondary | ICD-10-CM | POA: Diagnosis not present

## 2022-09-17 DIAGNOSIS — M9901 Segmental and somatic dysfunction of cervical region: Secondary | ICD-10-CM | POA: Diagnosis not present

## 2022-09-17 DIAGNOSIS — M9902 Segmental and somatic dysfunction of thoracic region: Secondary | ICD-10-CM | POA: Diagnosis not present

## 2022-09-17 DIAGNOSIS — M9905 Segmental and somatic dysfunction of pelvic region: Secondary | ICD-10-CM | POA: Diagnosis not present

## 2022-09-17 DIAGNOSIS — M9903 Segmental and somatic dysfunction of lumbar region: Secondary | ICD-10-CM | POA: Diagnosis not present

## 2022-09-21 DIAGNOSIS — Z6826 Body mass index (BMI) 26.0-26.9, adult: Secondary | ICD-10-CM | POA: Diagnosis not present

## 2022-09-21 DIAGNOSIS — R001 Bradycardia, unspecified: Secondary | ICD-10-CM | POA: Diagnosis not present

## 2022-09-21 DIAGNOSIS — E1169 Type 2 diabetes mellitus with other specified complication: Secondary | ICD-10-CM | POA: Diagnosis not present

## 2022-09-21 DIAGNOSIS — E785 Hyperlipidemia, unspecified: Secondary | ICD-10-CM | POA: Diagnosis not present

## 2022-09-24 DIAGNOSIS — M9901 Segmental and somatic dysfunction of cervical region: Secondary | ICD-10-CM | POA: Diagnosis not present

## 2022-09-24 DIAGNOSIS — M9903 Segmental and somatic dysfunction of lumbar region: Secondary | ICD-10-CM | POA: Diagnosis not present

## 2022-09-24 DIAGNOSIS — M9902 Segmental and somatic dysfunction of thoracic region: Secondary | ICD-10-CM | POA: Diagnosis not present

## 2022-09-24 DIAGNOSIS — M9905 Segmental and somatic dysfunction of pelvic region: Secondary | ICD-10-CM | POA: Diagnosis not present

## 2022-10-01 DIAGNOSIS — M9905 Segmental and somatic dysfunction of pelvic region: Secondary | ICD-10-CM | POA: Diagnosis not present

## 2022-10-01 DIAGNOSIS — M9903 Segmental and somatic dysfunction of lumbar region: Secondary | ICD-10-CM | POA: Diagnosis not present

## 2022-10-01 DIAGNOSIS — M9901 Segmental and somatic dysfunction of cervical region: Secondary | ICD-10-CM | POA: Diagnosis not present

## 2022-10-01 DIAGNOSIS — M9902 Segmental and somatic dysfunction of thoracic region: Secondary | ICD-10-CM | POA: Diagnosis not present

## 2022-10-10 DIAGNOSIS — M9905 Segmental and somatic dysfunction of pelvic region: Secondary | ICD-10-CM | POA: Diagnosis not present

## 2022-10-10 DIAGNOSIS — M9901 Segmental and somatic dysfunction of cervical region: Secondary | ICD-10-CM | POA: Diagnosis not present

## 2022-10-10 DIAGNOSIS — D519 Vitamin B12 deficiency anemia, unspecified: Secondary | ICD-10-CM | POA: Diagnosis not present

## 2022-10-10 DIAGNOSIS — M9902 Segmental and somatic dysfunction of thoracic region: Secondary | ICD-10-CM | POA: Diagnosis not present

## 2022-10-10 DIAGNOSIS — M9903 Segmental and somatic dysfunction of lumbar region: Secondary | ICD-10-CM | POA: Diagnosis not present

## 2022-10-24 DIAGNOSIS — E782 Mixed hyperlipidemia: Secondary | ICD-10-CM | POA: Diagnosis not present

## 2022-10-24 DIAGNOSIS — I4510 Unspecified right bundle-branch block: Secondary | ICD-10-CM | POA: Diagnosis not present

## 2022-10-24 DIAGNOSIS — I495 Sick sinus syndrome: Secondary | ICD-10-CM | POA: Diagnosis not present

## 2022-10-24 DIAGNOSIS — I2510 Atherosclerotic heart disease of native coronary artery without angina pectoris: Secondary | ICD-10-CM | POA: Diagnosis not present

## 2022-10-24 DIAGNOSIS — I10 Essential (primary) hypertension: Secondary | ICD-10-CM | POA: Diagnosis not present

## 2022-10-24 DIAGNOSIS — I491 Atrial premature depolarization: Secondary | ICD-10-CM | POA: Diagnosis not present

## 2022-10-24 DIAGNOSIS — I25119 Atherosclerotic heart disease of native coronary artery with unspecified angina pectoris: Secondary | ICD-10-CM | POA: Diagnosis not present

## 2022-10-26 DIAGNOSIS — G4733 Obstructive sleep apnea (adult) (pediatric): Secondary | ICD-10-CM | POA: Diagnosis not present

## 2022-10-26 DIAGNOSIS — I4510 Unspecified right bundle-branch block: Secondary | ICD-10-CM | POA: Diagnosis not present

## 2022-10-26 DIAGNOSIS — R002 Palpitations: Secondary | ICD-10-CM | POA: Diagnosis not present

## 2022-10-26 DIAGNOSIS — I493 Ventricular premature depolarization: Secondary | ICD-10-CM | POA: Diagnosis not present

## 2022-10-26 DIAGNOSIS — I209 Angina pectoris, unspecified: Secondary | ICD-10-CM | POA: Diagnosis not present

## 2022-10-26 DIAGNOSIS — R001 Bradycardia, unspecified: Secondary | ICD-10-CM | POA: Diagnosis not present

## 2022-10-26 DIAGNOSIS — Z794 Long term (current) use of insulin: Secondary | ICD-10-CM | POA: Diagnosis not present

## 2022-11-12 DIAGNOSIS — D519 Vitamin B12 deficiency anemia, unspecified: Secondary | ICD-10-CM | POA: Diagnosis not present

## 2022-11-13 DIAGNOSIS — R001 Bradycardia, unspecified: Secondary | ICD-10-CM | POA: Diagnosis not present

## 2022-11-15 DIAGNOSIS — R001 Bradycardia, unspecified: Secondary | ICD-10-CM | POA: Diagnosis not present

## 2022-11-21 DIAGNOSIS — R001 Bradycardia, unspecified: Secondary | ICD-10-CM | POA: Diagnosis not present

## 2022-11-23 DIAGNOSIS — R001 Bradycardia, unspecified: Secondary | ICD-10-CM | POA: Diagnosis not present

## 2022-12-22 IMAGING — CT CT CHEST HIGH RESOLUTION W/O CM
2 of 7 series · 14 of 36 positions shown, 17 images · non-contrast
Comparison: Cardiac CT 03/27/2018.

CLINICAL DATA: 80-year-old male with history of shortness of breath
and chest pain. Evaluate for interstitial lung disease.

EXAM:
CT CHEST WITHOUT CONTRAST
TECHNIQUE: Multidetector CT imaging of the chest was performed following the
standard protocol without intravenous contrast. High resolution
imaging of the lungs, as well as inspiratory and expiratory imaging,
was performed.

[Series 4: high resolution · axial · 0.78mm/px · z∈[-313,-32]mm · 11 of 339 slices shown, 14 images]
[im 29/339  mediastinal]
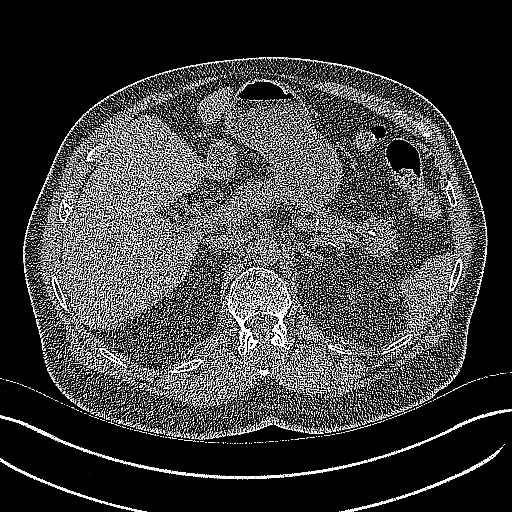
[im 29/339  lung]
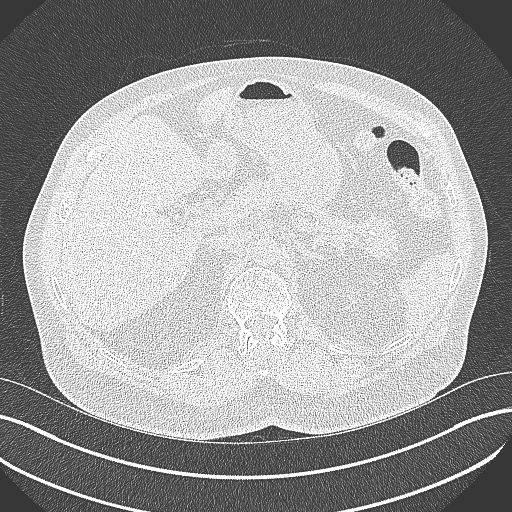
[im 57/339  lung]
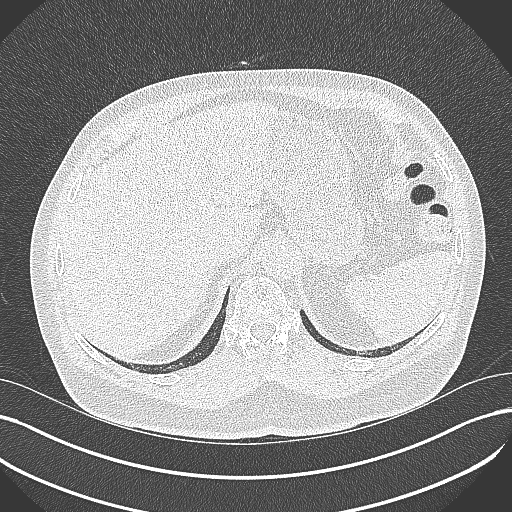
[im 85/339  lung]
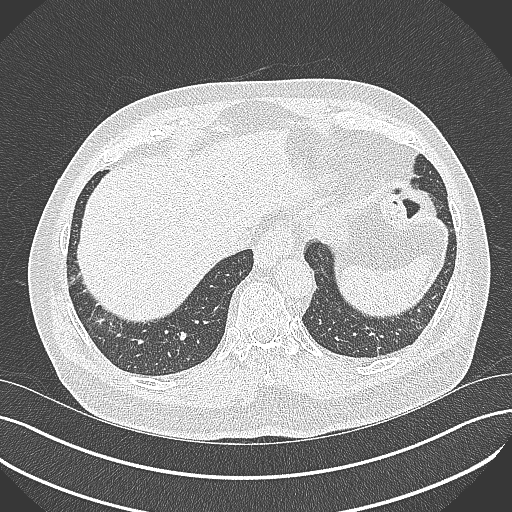
[im 113/339  lung]
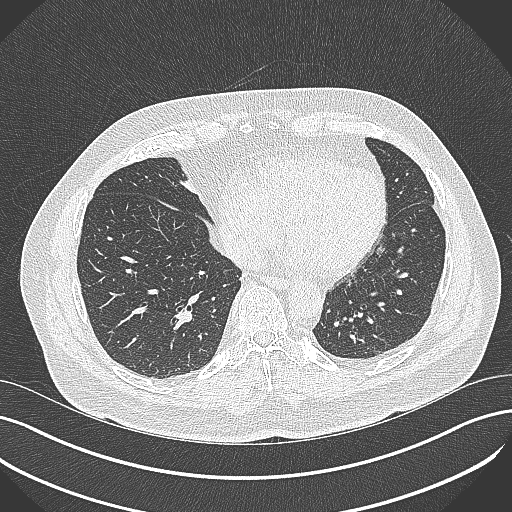
[im 141/339  mediastinal]
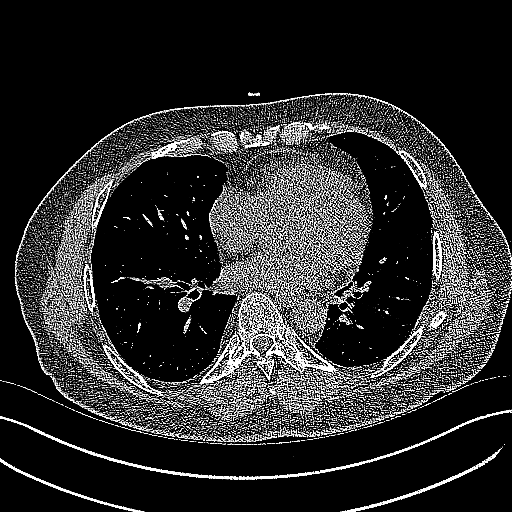
[im 141/339  lung]
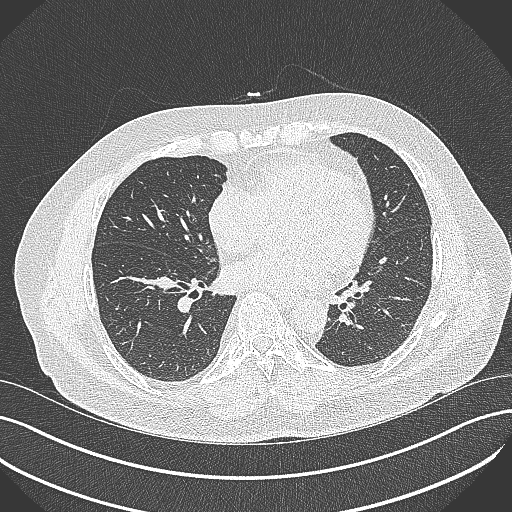
[im 170/339  lung]
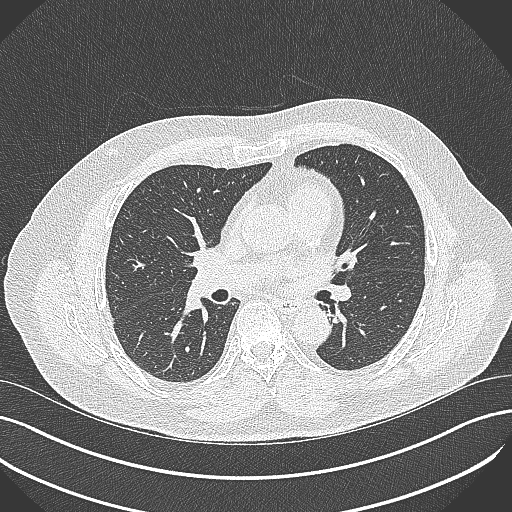
[im 198/339  lung]
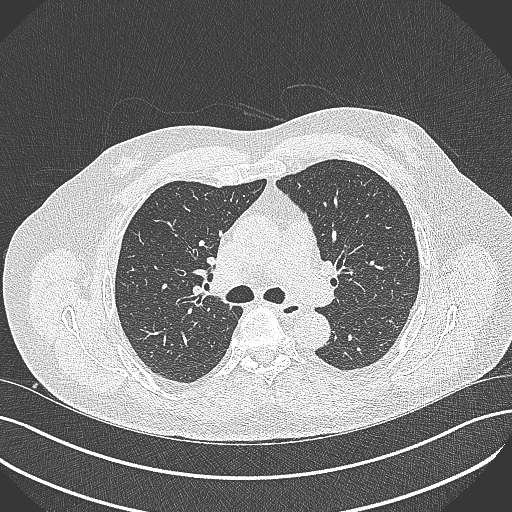
[im 226/339  lung]
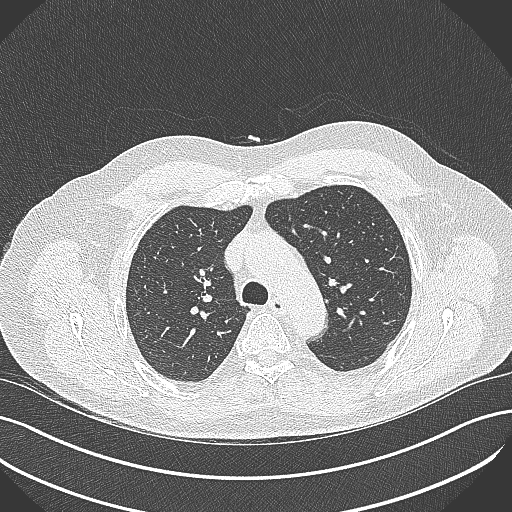
[im 254/339  mediastinal]
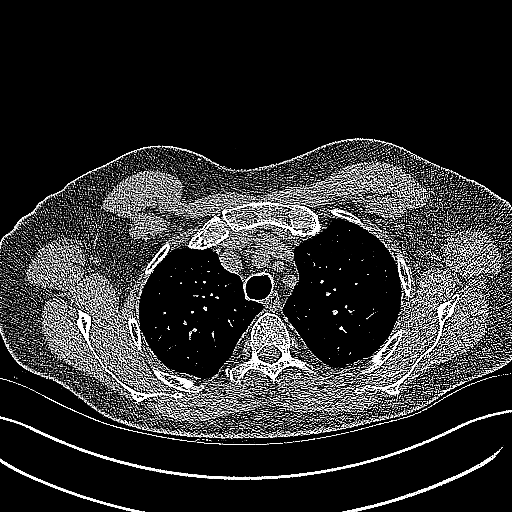
[im 254/339  lung]
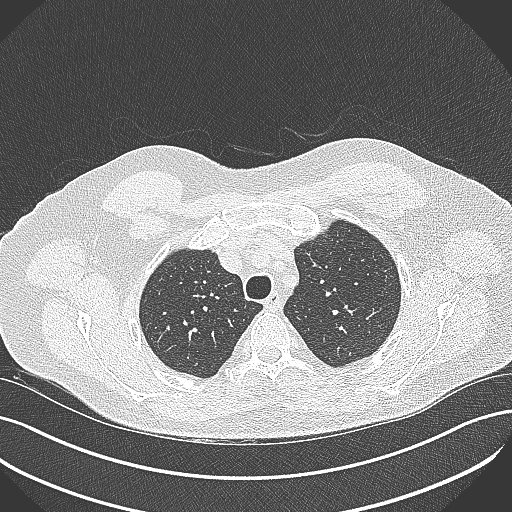
[im 282/339  lung]
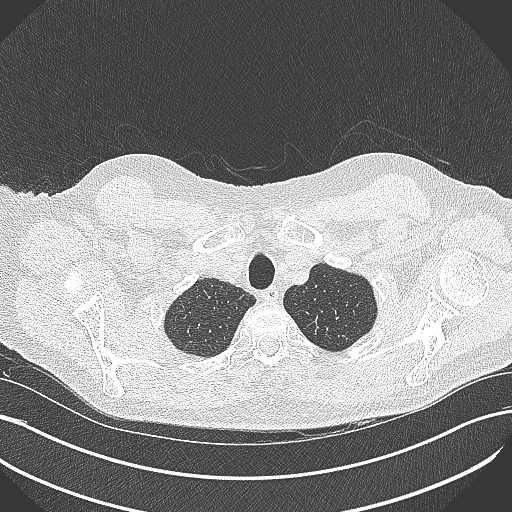
[im 310/339  lung]
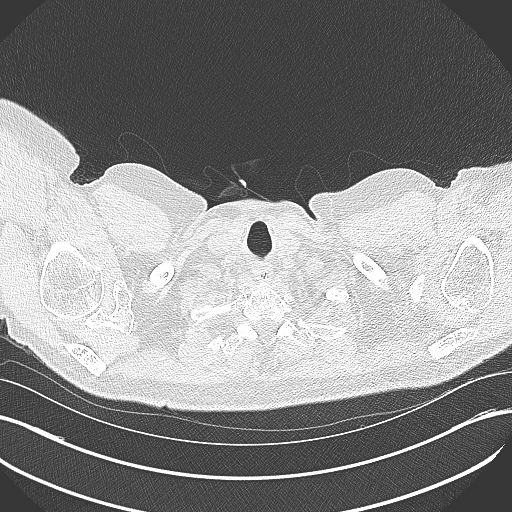

[Series 6: coronal · coronal · 0.66mm/px · 3 of 118 slices shown]
[im 24/118  lung]
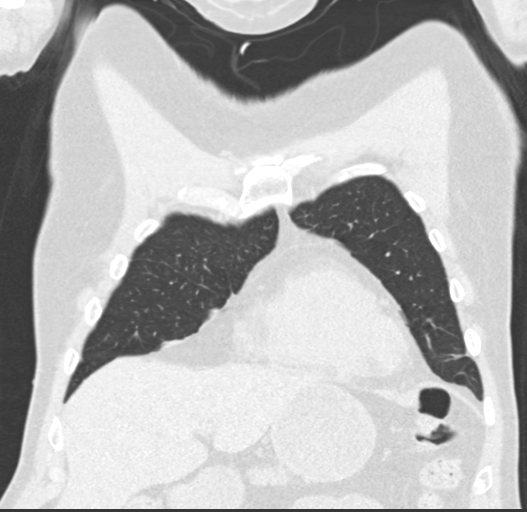
[im 47/118  lung]
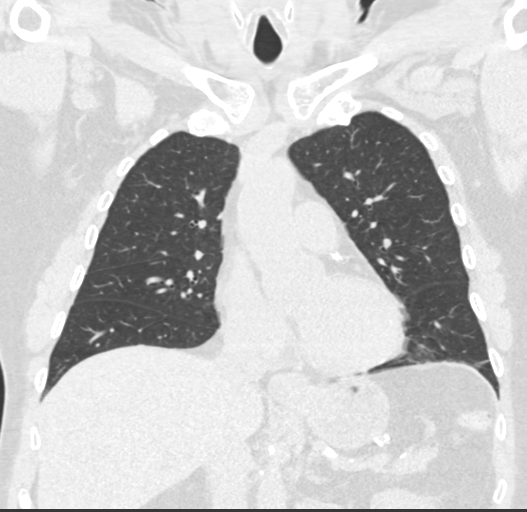
[im 71/118  lung]
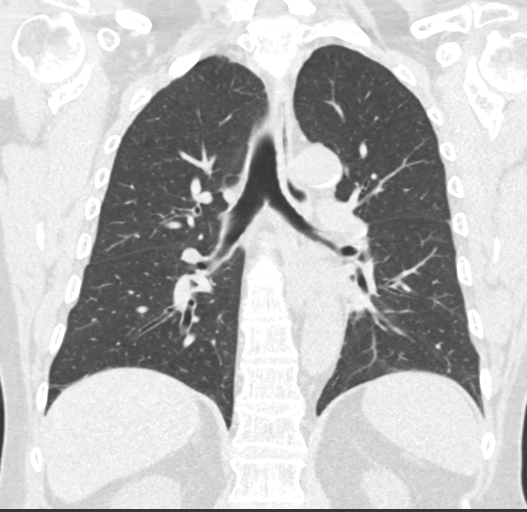

[14 of 36 positions shown; findings below may reference images not displayed]

FINDINGS: Cardiovascular: Heart size is normal. There is no significant
pericardial fluid, thickening or pericardial calcification. There is
aortic atherosclerosis, as well as atherosclerosis of the great
vessels of the mediastinum and the coronary arteries, including
calcified atherosclerotic plaque in the left main, left anterior
descending, left circumflex and right coronary arteries.

Mediastinum/Nodes: No pathologically enlarged mediastinal or hilar
lymph nodes. Please note that accurate exclusion of hilar adenopathy
is limited on noncontrast CT scans. Esophagus is unremarkable in
appearance. No axillary lymphadenopathy.

Lungs/Pleura: There are some areas of very mild subsegmental
atelectasis or scarring in the lung bases bilaterally, similar to
prior cardiac CT 03/26/2018. No generalized regions of ground-glass
attenuation, septal thickening, subpleural reticulation, parenchymal
banding, traction bronchiectasis or honeycombing are noted to
clearly indicate interstitial lung disease. Inspiratory and
expiratory imaging is unremarkable. No acute consolidative airspace
disease. No pleural effusions. 2 mm pulmonary nodule in the left
lower lobe (axial image 132 of series 3). No other larger more
suspicious appearing pulmonary nodules or masses are noted. No acute
consolidative airspace disease. No pleural effusions.

Upper Abdomen: 4 mm nonobstructive calculus in the upper pole
collecting system of the left kidney. Aortic atherosclerosis.
Multiple subcentimeter low-attenuation lesions scattered throughout
the hepatic parenchyma, incompletely characterized on today's
noncontrast CT examination, previously characterized as tiny cysts
or biliary hamartomas on abdominal MRI 03/26/2013.

Musculoskeletal: There are no aggressive appearing lytic or blastic
lesions noted in the visualized portions of the skeleton.
IMPRESSION: 1. No definitive imaging findings to suggest interstitial lung
disease. If there is persistent clinical concern for interstitial
lung disease, repeat high-resolution chest CT could be considered in
12 months to assess for temporal changes in the appearance of the
lung parenchyma.
2. 2 mm left lower lobe pulmonary nodule, nonspecific, but
statistically likely benign. No follow-up needed if patient is
low-risk. Non-contrast chest CT can be considered in 12 months if
patient is high-risk. This recommendation follows the consensus
statement: Guidelines for Management of Incidental Pulmonary Nodules
Detected on CT Images: From the [HOSPITAL] 7754; Radiology
7754; [DATE].
3. Aortic atherosclerosis, in addition to left main and 3 vessel
coronary artery disease. Assessment for potential risk factor
modification, dietary therapy or pharmacologic therapy may be
warranted, if clinically indicated.
4. 4 mm nonobstructive calculus in the upper pole collecting system
of the left kidney.

Aortic Atherosclerosis (2BTSF-SDD.D).

## 2022-12-26 DIAGNOSIS — H524 Presbyopia: Secondary | ICD-10-CM | POA: Diagnosis not present

## 2023-01-10 DIAGNOSIS — G4733 Obstructive sleep apnea (adult) (pediatric): Secondary | ICD-10-CM | POA: Diagnosis not present

## 2023-01-25 DIAGNOSIS — D519 Vitamin B12 deficiency anemia, unspecified: Secondary | ICD-10-CM | POA: Diagnosis not present

## 2023-02-19 DIAGNOSIS — I495 Sick sinus syndrome: Secondary | ICD-10-CM | POA: Diagnosis not present

## 2023-02-19 DIAGNOSIS — E1142 Type 2 diabetes mellitus with diabetic polyneuropathy: Secondary | ICD-10-CM | POA: Diagnosis not present

## 2023-02-19 DIAGNOSIS — I25119 Atherosclerotic heart disease of native coronary artery with unspecified angina pectoris: Secondary | ICD-10-CM | POA: Diagnosis not present

## 2023-02-19 DIAGNOSIS — E782 Mixed hyperlipidemia: Secondary | ICD-10-CM | POA: Diagnosis not present

## 2023-02-19 DIAGNOSIS — Z79899 Other long term (current) drug therapy: Secondary | ICD-10-CM | POA: Diagnosis not present

## 2023-02-19 DIAGNOSIS — I959 Hypotension, unspecified: Secondary | ICD-10-CM | POA: Diagnosis not present

## 2023-02-19 DIAGNOSIS — I3139 Other pericardial effusion (noninflammatory): Secondary | ICD-10-CM | POA: Diagnosis not present

## 2023-02-19 DIAGNOSIS — I119 Hypertensive heart disease without heart failure: Secondary | ICD-10-CM | POA: Diagnosis not present

## 2023-02-19 DIAGNOSIS — I9789 Other postprocedural complications and disorders of the circulatory system, not elsewhere classified: Secondary | ICD-10-CM | POA: Diagnosis not present

## 2023-02-19 DIAGNOSIS — R001 Bradycardia, unspecified: Secondary | ICD-10-CM | POA: Diagnosis not present

## 2023-02-19 DIAGNOSIS — J9811 Atelectasis: Secondary | ICD-10-CM | POA: Diagnosis not present

## 2023-02-19 DIAGNOSIS — Z950 Presence of cardiac pacemaker: Secondary | ICD-10-CM | POA: Diagnosis not present

## 2023-02-19 DIAGNOSIS — E1151 Type 2 diabetes mellitus with diabetic peripheral angiopathy without gangrene: Secondary | ICD-10-CM | POA: Diagnosis not present

## 2023-02-19 DIAGNOSIS — Z794 Long term (current) use of insulin: Secondary | ICD-10-CM | POA: Diagnosis not present

## 2023-02-19 DIAGNOSIS — Z7984 Long term (current) use of oral hypoglycemic drugs: Secondary | ICD-10-CM | POA: Diagnosis not present

## 2023-02-19 DIAGNOSIS — R0989 Other specified symptoms and signs involving the circulatory and respiratory systems: Secondary | ICD-10-CM | POA: Diagnosis not present

## 2023-02-19 DIAGNOSIS — I517 Cardiomegaly: Secondary | ICD-10-CM | POA: Diagnosis not present

## 2023-02-20 DIAGNOSIS — I3139 Other pericardial effusion (noninflammatory): Secondary | ICD-10-CM | POA: Diagnosis not present

## 2023-02-20 DIAGNOSIS — I119 Hypertensive heart disease without heart failure: Secondary | ICD-10-CM | POA: Diagnosis not present

## 2023-02-20 DIAGNOSIS — J9811 Atelectasis: Secondary | ICD-10-CM | POA: Diagnosis not present

## 2023-02-20 DIAGNOSIS — Z79899 Other long term (current) drug therapy: Secondary | ICD-10-CM | POA: Diagnosis not present

## 2023-02-20 DIAGNOSIS — R001 Bradycardia, unspecified: Secondary | ICD-10-CM | POA: Diagnosis not present

## 2023-02-20 DIAGNOSIS — Z4682 Encounter for fitting and adjustment of non-vascular catheter: Secondary | ICD-10-CM | POA: Diagnosis not present

## 2023-02-20 DIAGNOSIS — Z794 Long term (current) use of insulin: Secondary | ICD-10-CM | POA: Diagnosis not present

## 2023-02-20 DIAGNOSIS — I495 Sick sinus syndrome: Secondary | ICD-10-CM | POA: Diagnosis not present

## 2023-02-20 DIAGNOSIS — E1142 Type 2 diabetes mellitus with diabetic polyneuropathy: Secondary | ICD-10-CM | POA: Diagnosis not present

## 2023-02-20 DIAGNOSIS — I959 Hypotension, unspecified: Secondary | ICD-10-CM | POA: Diagnosis not present

## 2023-02-20 DIAGNOSIS — Z7984 Long term (current) use of oral hypoglycemic drugs: Secondary | ICD-10-CM | POA: Diagnosis not present

## 2023-02-20 DIAGNOSIS — E1151 Type 2 diabetes mellitus with diabetic peripheral angiopathy without gangrene: Secondary | ICD-10-CM | POA: Diagnosis not present

## 2023-02-20 DIAGNOSIS — I25119 Atherosclerotic heart disease of native coronary artery with unspecified angina pectoris: Secondary | ICD-10-CM | POA: Diagnosis not present

## 2023-02-20 DIAGNOSIS — E782 Mixed hyperlipidemia: Secondary | ICD-10-CM | POA: Diagnosis not present

## 2023-02-21 DIAGNOSIS — I25119 Atherosclerotic heart disease of native coronary artery with unspecified angina pectoris: Secondary | ICD-10-CM | POA: Diagnosis not present

## 2023-02-21 DIAGNOSIS — I3139 Other pericardial effusion (noninflammatory): Secondary | ICD-10-CM | POA: Diagnosis not present

## 2023-02-21 DIAGNOSIS — Z79899 Other long term (current) drug therapy: Secondary | ICD-10-CM | POA: Diagnosis not present

## 2023-02-21 DIAGNOSIS — Z950 Presence of cardiac pacemaker: Secondary | ICD-10-CM | POA: Diagnosis not present

## 2023-02-21 DIAGNOSIS — J9811 Atelectasis: Secondary | ICD-10-CM | POA: Diagnosis not present

## 2023-02-21 DIAGNOSIS — E782 Mixed hyperlipidemia: Secondary | ICD-10-CM | POA: Diagnosis not present

## 2023-02-21 DIAGNOSIS — I495 Sick sinus syndrome: Secondary | ICD-10-CM | POA: Diagnosis not present

## 2023-02-21 DIAGNOSIS — E1142 Type 2 diabetes mellitus with diabetic polyneuropathy: Secondary | ICD-10-CM | POA: Diagnosis not present

## 2023-02-21 DIAGNOSIS — E1151 Type 2 diabetes mellitus with diabetic peripheral angiopathy without gangrene: Secondary | ICD-10-CM | POA: Diagnosis not present

## 2023-02-21 DIAGNOSIS — Z794 Long term (current) use of insulin: Secondary | ICD-10-CM | POA: Diagnosis not present

## 2023-02-21 DIAGNOSIS — I119 Hypertensive heart disease without heart failure: Secondary | ICD-10-CM | POA: Diagnosis not present

## 2023-02-21 DIAGNOSIS — Z7984 Long term (current) use of oral hypoglycemic drugs: Secondary | ICD-10-CM | POA: Diagnosis not present

## 2023-02-21 DIAGNOSIS — R001 Bradycardia, unspecified: Secondary | ICD-10-CM | POA: Diagnosis not present

## 2023-02-21 DIAGNOSIS — Z9889 Other specified postprocedural states: Secondary | ICD-10-CM | POA: Diagnosis not present

## 2023-03-05 DIAGNOSIS — R001 Bradycardia, unspecified: Secondary | ICD-10-CM | POA: Diagnosis not present

## 2023-03-21 DIAGNOSIS — Z45018 Encounter for adjustment and management of other part of cardiac pacemaker: Secondary | ICD-10-CM | POA: Diagnosis not present

## 2023-03-21 DIAGNOSIS — Z7984 Long term (current) use of oral hypoglycemic drugs: Secondary | ICD-10-CM | POA: Diagnosis not present

## 2023-03-21 DIAGNOSIS — I4510 Unspecified right bundle-branch block: Secondary | ICD-10-CM | POA: Diagnosis not present

## 2023-03-21 DIAGNOSIS — R918 Other nonspecific abnormal finding of lung field: Secondary | ICD-10-CM | POA: Diagnosis not present

## 2023-03-21 DIAGNOSIS — Z950 Presence of cardiac pacemaker: Secondary | ICD-10-CM | POA: Diagnosis not present

## 2023-03-21 DIAGNOSIS — I2510 Atherosclerotic heart disease of native coronary artery without angina pectoris: Secondary | ICD-10-CM | POA: Diagnosis not present

## 2023-03-21 DIAGNOSIS — Z794 Long term (current) use of insulin: Secondary | ICD-10-CM | POA: Diagnosis not present

## 2023-03-21 DIAGNOSIS — Z79899 Other long term (current) drug therapy: Secondary | ICD-10-CM | POA: Diagnosis not present

## 2023-03-21 DIAGNOSIS — D696 Thrombocytopenia, unspecified: Secondary | ICD-10-CM | POA: Diagnosis not present

## 2023-03-21 DIAGNOSIS — R0602 Shortness of breath: Secondary | ICD-10-CM | POA: Diagnosis not present

## 2023-03-21 DIAGNOSIS — R079 Chest pain, unspecified: Secondary | ICD-10-CM | POA: Diagnosis not present

## 2023-03-21 DIAGNOSIS — R0789 Other chest pain: Secondary | ICD-10-CM | POA: Diagnosis not present

## 2023-03-21 DIAGNOSIS — D649 Anemia, unspecified: Secondary | ICD-10-CM | POA: Diagnosis not present

## 2023-03-21 DIAGNOSIS — E1142 Type 2 diabetes mellitus with diabetic polyneuropathy: Secondary | ICD-10-CM | POA: Diagnosis not present

## 2023-03-21 DIAGNOSIS — I2699 Other pulmonary embolism without acute cor pulmonale: Secondary | ICD-10-CM | POA: Diagnosis not present

## 2023-03-22 DIAGNOSIS — I2699 Other pulmonary embolism without acute cor pulmonale: Secondary | ICD-10-CM | POA: Diagnosis not present

## 2023-03-26 DIAGNOSIS — Z1331 Encounter for screening for depression: Secondary | ICD-10-CM | POA: Diagnosis not present

## 2023-03-26 DIAGNOSIS — Z950 Presence of cardiac pacemaker: Secondary | ICD-10-CM | POA: Diagnosis not present

## 2023-03-26 DIAGNOSIS — I2699 Other pulmonary embolism without acute cor pulmonale: Secondary | ICD-10-CM | POA: Diagnosis not present

## 2023-03-26 DIAGNOSIS — Z9181 History of falling: Secondary | ICD-10-CM | POA: Diagnosis not present

## 2023-03-26 DIAGNOSIS — Z0000 Encounter for general adult medical examination without abnormal findings: Secondary | ICD-10-CM | POA: Diagnosis not present

## 2023-04-01 ENCOUNTER — Telehealth: Payer: Self-pay

## 2023-04-01 NOTE — Progress Notes (Signed)
   04/01/2023  Patient ID: Craig Armstrong, male   DOB: July 26, 1940, 83 y.o.   MRN: [id number]  Contacted patient regarding medication assistance. The patient reports income increases that may exclude him from qualifying for previously enrolled PAP programs.   Patient will meet with embedded pharmacist on Thursday to review income documents and discuss options.  Harlon Flor, PharmD Clinical Pharmacist  [phone]

## 2023-04-02 DIAGNOSIS — I495 Sick sinus syndrome: Secondary | ICD-10-CM | POA: Diagnosis not present

## 2023-04-02 DIAGNOSIS — Z45018 Encounter for adjustment and management of other part of cardiac pacemaker: Secondary | ICD-10-CM | POA: Diagnosis not present

## 2023-04-04 DIAGNOSIS — D519 Vitamin B12 deficiency anemia, unspecified: Secondary | ICD-10-CM | POA: Diagnosis not present

## 2023-04-10 ENCOUNTER — Telehealth: Payer: Self-pay | Admitting: Pharmacist

## 2023-04-10 NOTE — Progress Notes (Signed)
 Attempted to contact patient for medication access re: Craig Armstrong, and Eliquis. Left HIPAA compliant message for patient to return my call at their convenience.   [name], PharmD Clinical Pharmacist [place] Direct Dial: [phone]

## 2023-04-11 ENCOUNTER — Telehealth: Payer: Self-pay | Admitting: Pharmacist

## 2023-04-11 NOTE — Progress Notes (Signed)
 Attempted to contact patient for medication access re: Beaulah Corin, and Eliquis. Left HIPAA compliant message for patient to return my call at their convenience.   [name], PharmD Clinical Pharmacist [place] [facility]ial: [phone]

## 2023-04-12 DIAGNOSIS — G4733 Obstructive sleep apnea (adult) (pediatric): Secondary | ICD-10-CM | POA: Diagnosis not present

## 2023-04-18 ENCOUNTER — Telehealth: Payer: Self-pay | Admitting: Pharmacist

## 2023-04-18 NOTE — Progress Notes (Signed)
   04/18/2023  Patient ID: Kathryne Eriksson, male   DOB: [birth year], 83 y.o.   MRN: [id number]  Several unsuccessful attempts to engage with patient. Sending patient a card from the office requesting a call back regarding reenrollment with patient assistance.   [name], PharmD Clinical Pharmacist [place] Direct Dial: [phone]

## 2023-04-25 ENCOUNTER — Telehealth: Payer: Self-pay

## 2023-04-25 ENCOUNTER — Other Ambulatory Visit: Payer: Self-pay | Admitting: Pharmacist

## 2023-04-25 NOTE — Telephone Encounter (Signed)
 PAP: Application for Marcelline Deist has been submitted to [employer] ([employer]), via fax  PAP: Application for Evaristo Bury has been submitted to [employer], via fax  PAP: Application for Eliquis has been submitted to [employer] ([employer]), via fax

## 2023-04-25 NOTE — Progress Notes (Signed)
   04/25/2023  Patient ID: Craig Armstrong, male   DOB: July 20, 1940, 83 y.o.   MRN: [id number]  [name] stopped by office this morning and signed patient portions of assistance applications. Sent to [place] for further coordination.   [name], PharmD Clinical Pharmacist [place] Direct Dial: [phone]

## 2023-04-30 NOTE — Telephone Encounter (Signed)
 Received fax from [employer] needing patients Household size, submitted form with proof of income to [employer]

## 2023-05-02 NOTE — Telephone Encounter (Signed)
 PAP: Patient assistance application for Craig Armstrong has been approved by PAP Companies: [employer] from 04/30/2024 to 02/05/2024. Medication should be delivered to PAP Delivery: Provider's office. For further shipping updates, please [employer] at [phone]. Patient ID is: [id number]

## 2023-05-02 NOTE — Progress Notes (Deleted)
 Pharmacy Medication Assistance Program Note    05/02/2023  Patient ID: Craig Armstrong, male   DOB: 1940/03/30, 83 y.o.   MRN: [id number]     04/25/2023  Outreach Medication One  Initial Outreach Date (Medication One) 04/25/2023  Manufacturer Medication One [employer]  [employer] [facility] Farxiga  Dose of Farxiga 5mg   Type of Radiographer, therapeutic Assistance  Name of Prescriber Amada Kingfisher  Date Application Received From Patient 04/25/2023  Application Items Received From Patient Application;Proof of Income  Date Application Received From Provider 04/25/2023  Date Application Submitted to Manufacturer 04/25/2023  Method Application Sent to [employer]

## 2023-05-03 DIAGNOSIS — I2510 Atherosclerotic heart disease of native coronary artery without angina pectoris: Secondary | ICD-10-CM | POA: Diagnosis not present

## 2023-05-03 DIAGNOSIS — Z950 Presence of cardiac pacemaker: Secondary | ICD-10-CM | POA: Diagnosis not present

## 2023-05-03 DIAGNOSIS — I2699 Other pulmonary embolism without acute cor pulmonale: Secondary | ICD-10-CM | POA: Diagnosis not present

## 2023-05-03 DIAGNOSIS — Z7901 Long term (current) use of anticoagulants: Secondary | ICD-10-CM | POA: Diagnosis not present

## 2023-05-07 NOTE — Telephone Encounter (Signed)
 PAP: Patient assistance application for Marcelline Deist has been approved by PAP Companies: [employer] from 04/26/2023 to 02/05/2024. Medication should be delivered to PAP Delivery: Home. For further shipping updates, please contact [employer] ([employer]) at [phone]. Patient ID is: [id number]    PAP: Patient has been denied for patient assistance by [employer] ([employer]) due to has not met OOP requirement

## 2023-05-28 DIAGNOSIS — Z950 Presence of cardiac pacemaker: Secondary | ICD-10-CM | POA: Diagnosis not present

## 2023-05-28 DIAGNOSIS — I361 Nonrheumatic tricuspid (valve) insufficiency: Secondary | ICD-10-CM | POA: Diagnosis not present

## 2023-05-30 DIAGNOSIS — R002 Palpitations: Secondary | ICD-10-CM | POA: Diagnosis not present

## 2023-05-30 DIAGNOSIS — I495 Sick sinus syndrome: Secondary | ICD-10-CM | POA: Diagnosis not present

## 2023-05-30 DIAGNOSIS — I2510 Atherosclerotic heart disease of native coronary artery without angina pectoris: Secondary | ICD-10-CM | POA: Diagnosis not present

## 2023-05-30 DIAGNOSIS — R42 Dizziness and giddiness: Secondary | ICD-10-CM | POA: Diagnosis not present

## 2023-05-30 DIAGNOSIS — Z950 Presence of cardiac pacemaker: Secondary | ICD-10-CM | POA: Diagnosis not present

## 2023-05-30 DIAGNOSIS — R001 Bradycardia, unspecified: Secondary | ICD-10-CM | POA: Diagnosis not present

## 2023-06-03 DIAGNOSIS — Z950 Presence of cardiac pacemaker: Secondary | ICD-10-CM | POA: Diagnosis not present

## 2023-06-03 DIAGNOSIS — D519 Vitamin B12 deficiency anemia, unspecified: Secondary | ICD-10-CM | POA: Diagnosis not present

## 2023-06-16 DIAGNOSIS — Z45018 Encounter for adjustment and management of other part of cardiac pacemaker: Secondary | ICD-10-CM | POA: Diagnosis not present

## 2023-06-21 DIAGNOSIS — I951 Orthostatic hypotension: Secondary | ICD-10-CM | POA: Diagnosis not present

## 2023-06-21 DIAGNOSIS — E785 Hyperlipidemia, unspecified: Secondary | ICD-10-CM | POA: Diagnosis not present

## 2023-06-21 DIAGNOSIS — R062 Wheezing: Secondary | ICD-10-CM | POA: Diagnosis not present

## 2023-06-21 DIAGNOSIS — E1169 Type 2 diabetes mellitus with other specified complication: Secondary | ICD-10-CM | POA: Diagnosis not present

## 2023-07-13 DIAGNOSIS — G4733 Obstructive sleep apnea (adult) (pediatric): Secondary | ICD-10-CM | POA: Diagnosis not present

## 2023-07-24 DIAGNOSIS — D170 Benign lipomatous neoplasm of skin and subcutaneous tissue of head, face and neck: Secondary | ICD-10-CM | POA: Diagnosis not present

## 2023-07-24 DIAGNOSIS — M25551 Pain in right hip: Secondary | ICD-10-CM | POA: Diagnosis not present

## 2023-07-24 DIAGNOSIS — L304 Erythema intertrigo: Secondary | ICD-10-CM | POA: Diagnosis not present

## 2023-07-24 DIAGNOSIS — Z6826 Body mass index (BMI) 26.0-26.9, adult: Secondary | ICD-10-CM | POA: Diagnosis not present

## 2023-07-26 DIAGNOSIS — D171 Benign lipomatous neoplasm of skin and subcutaneous tissue of trunk: Secondary | ICD-10-CM | POA: Diagnosis not present

## 2023-07-26 DIAGNOSIS — L820 Inflamed seborrheic keratosis: Secondary | ICD-10-CM | POA: Diagnosis not present

## 2023-07-26 DIAGNOSIS — D485 Neoplasm of uncertain behavior of skin: Secondary | ICD-10-CM | POA: Diagnosis not present

## 2023-07-29 DIAGNOSIS — M25551 Pain in right hip: Secondary | ICD-10-CM | POA: Diagnosis not present

## 2023-07-30 DIAGNOSIS — D170 Benign lipomatous neoplasm of skin and subcutaneous tissue of head, face and neck: Secondary | ICD-10-CM | POA: Diagnosis not present

## 2023-07-30 DIAGNOSIS — E1169 Type 2 diabetes mellitus with other specified complication: Secondary | ICD-10-CM | POA: Diagnosis not present

## 2023-07-30 DIAGNOSIS — E785 Hyperlipidemia, unspecified: Secondary | ICD-10-CM | POA: Diagnosis not present

## 2023-08-07 DIAGNOSIS — I9789 Other postprocedural complications and disorders of the circulatory system, not elsewhere classified: Secondary | ICD-10-CM | POA: Diagnosis not present

## 2023-08-07 DIAGNOSIS — R001 Bradycardia, unspecified: Secondary | ICD-10-CM | POA: Diagnosis not present

## 2023-08-07 DIAGNOSIS — I3139 Other pericardial effusion (noninflammatory): Secondary | ICD-10-CM | POA: Diagnosis not present

## 2023-08-07 DIAGNOSIS — I495 Sick sinus syndrome: Secondary | ICD-10-CM | POA: Diagnosis not present

## 2023-08-07 DIAGNOSIS — R0789 Other chest pain: Secondary | ICD-10-CM | POA: Diagnosis not present

## 2023-08-13 DIAGNOSIS — I3139 Other pericardial effusion (noninflammatory): Secondary | ICD-10-CM | POA: Diagnosis not present

## 2023-08-16 DIAGNOSIS — R55 Syncope and collapse: Secondary | ICD-10-CM | POA: Diagnosis not present

## 2023-08-16 DIAGNOSIS — D649 Anemia, unspecified: Secondary | ICD-10-CM | POA: Diagnosis not present

## 2023-08-16 DIAGNOSIS — R61 Generalized hyperhidrosis: Secondary | ICD-10-CM | POA: Diagnosis not present

## 2023-08-16 DIAGNOSIS — N289 Disorder of kidney and ureter, unspecified: Secondary | ICD-10-CM | POA: Diagnosis not present

## 2023-08-16 DIAGNOSIS — R1030 Lower abdominal pain, unspecified: Secondary | ICD-10-CM | POA: Diagnosis not present

## 2023-08-16 DIAGNOSIS — R42 Dizziness and giddiness: Secondary | ICD-10-CM | POA: Diagnosis not present

## 2023-08-16 DIAGNOSIS — R3129 Other microscopic hematuria: Secondary | ICD-10-CM | POA: Diagnosis not present

## 2023-08-26 DIAGNOSIS — R233 Spontaneous ecchymoses: Secondary | ICD-10-CM | POA: Diagnosis not present

## 2023-08-26 DIAGNOSIS — E1169 Type 2 diabetes mellitus with other specified complication: Secondary | ICD-10-CM | POA: Diagnosis not present

## 2023-08-26 DIAGNOSIS — Z125 Encounter for screening for malignant neoplasm of prostate: Secondary | ICD-10-CM | POA: Diagnosis not present

## 2023-08-26 DIAGNOSIS — C44629 Squamous cell carcinoma of skin of left upper limb, including shoulder: Secondary | ICD-10-CM | POA: Diagnosis not present

## 2023-08-26 DIAGNOSIS — L578 Other skin changes due to chronic exposure to nonionizing radiation: Secondary | ICD-10-CM | POA: Diagnosis not present

## 2023-08-26 DIAGNOSIS — E785 Hyperlipidemia, unspecified: Secondary | ICD-10-CM | POA: Diagnosis not present

## 2023-08-26 DIAGNOSIS — R3129 Other microscopic hematuria: Secondary | ICD-10-CM | POA: Diagnosis not present

## 2023-08-28 DIAGNOSIS — I495 Sick sinus syndrome: Secondary | ICD-10-CM | POA: Diagnosis not present

## 2023-08-28 DIAGNOSIS — R001 Bradycardia, unspecified: Secondary | ICD-10-CM | POA: Diagnosis not present

## 2023-08-28 DIAGNOSIS — Z45018 Encounter for adjustment and management of other part of cardiac pacemaker: Secondary | ICD-10-CM | POA: Diagnosis not present

## 2023-08-28 DIAGNOSIS — R0789 Other chest pain: Secondary | ICD-10-CM | POA: Diagnosis not present

## 2023-08-30 DIAGNOSIS — I495 Sick sinus syndrome: Secondary | ICD-10-CM | POA: Diagnosis not present

## 2023-08-30 DIAGNOSIS — Z45018 Encounter for adjustment and management of other part of cardiac pacemaker: Secondary | ICD-10-CM | POA: Diagnosis not present

## 2023-09-02 DIAGNOSIS — C44629 Squamous cell carcinoma of skin of left upper limb, including shoulder: Secondary | ICD-10-CM | POA: Diagnosis not present

## 2023-09-03 DIAGNOSIS — R001 Bradycardia, unspecified: Secondary | ICD-10-CM | POA: Diagnosis not present

## 2023-09-03 DIAGNOSIS — I10 Essential (primary) hypertension: Secondary | ICD-10-CM | POA: Diagnosis not present

## 2023-09-03 DIAGNOSIS — I495 Sick sinus syndrome: Secondary | ICD-10-CM | POA: Diagnosis not present

## 2023-09-03 DIAGNOSIS — I4510 Unspecified right bundle-branch block: Secondary | ICD-10-CM | POA: Diagnosis not present

## 2023-09-03 DIAGNOSIS — E782 Mixed hyperlipidemia: Secondary | ICD-10-CM | POA: Diagnosis not present

## 2023-09-03 DIAGNOSIS — I209 Angina pectoris, unspecified: Secondary | ICD-10-CM | POA: Diagnosis not present

## 2023-09-03 DIAGNOSIS — Z7901 Long term (current) use of anticoagulants: Secondary | ICD-10-CM | POA: Diagnosis not present

## 2023-09-03 DIAGNOSIS — R42 Dizziness and giddiness: Secondary | ICD-10-CM | POA: Diagnosis not present

## 2023-09-03 DIAGNOSIS — I2510 Atherosclerotic heart disease of native coronary artery without angina pectoris: Secondary | ICD-10-CM | POA: Diagnosis not present

## 2023-09-03 DIAGNOSIS — T82110D Breakdown (mechanical) of cardiac electrode, subsequent encounter: Secondary | ICD-10-CM | POA: Diagnosis not present

## 2023-09-03 DIAGNOSIS — R0789 Other chest pain: Secondary | ICD-10-CM | POA: Diagnosis not present

## 2023-09-03 DIAGNOSIS — Z45018 Encounter for adjustment and management of other part of cardiac pacemaker: Secondary | ICD-10-CM | POA: Diagnosis not present

## 2023-09-05 DIAGNOSIS — Z950 Presence of cardiac pacemaker: Secondary | ICD-10-CM | POA: Diagnosis not present

## 2023-09-09 DIAGNOSIS — I499 Cardiac arrhythmia, unspecified: Secondary | ICD-10-CM | POA: Diagnosis not present

## 2023-09-09 DIAGNOSIS — I4510 Unspecified right bundle-branch block: Secondary | ICD-10-CM | POA: Diagnosis not present

## 2023-09-09 DIAGNOSIS — Z45018 Encounter for adjustment and management of other part of cardiac pacemaker: Secondary | ICD-10-CM | POA: Diagnosis not present

## 2023-09-10 DIAGNOSIS — M6289 Other specified disorders of muscle: Secondary | ICD-10-CM | POA: Diagnosis not present

## 2023-09-10 DIAGNOSIS — M25551 Pain in right hip: Secondary | ICD-10-CM | POA: Diagnosis not present

## 2023-09-10 DIAGNOSIS — Z6826 Body mass index (BMI) 26.0-26.9, adult: Secondary | ICD-10-CM | POA: Diagnosis not present

## 2023-09-16 DIAGNOSIS — M25551 Pain in right hip: Secondary | ICD-10-CM | POA: Diagnosis not present

## 2023-09-18 DIAGNOSIS — M25551 Pain in right hip: Secondary | ICD-10-CM | POA: Diagnosis not present

## 2023-09-25 DIAGNOSIS — M25551 Pain in right hip: Secondary | ICD-10-CM | POA: Diagnosis not present

## 2023-09-25 DIAGNOSIS — K080 Exfoliation of teeth due to systemic causes: Secondary | ICD-10-CM | POA: Diagnosis not present

## 2023-09-27 DIAGNOSIS — M25551 Pain in right hip: Secondary | ICD-10-CM | POA: Diagnosis not present

## 2023-09-30 DIAGNOSIS — M25551 Pain in right hip: Secondary | ICD-10-CM | POA: Diagnosis not present

## 2023-10-01 DIAGNOSIS — Z950 Presence of cardiac pacemaker: Secondary | ICD-10-CM | POA: Diagnosis not present

## 2023-10-02 DIAGNOSIS — M25551 Pain in right hip: Secondary | ICD-10-CM | POA: Diagnosis not present

## 2023-10-10 DIAGNOSIS — T82110A Breakdown (mechanical) of cardiac electrode, initial encounter: Secondary | ICD-10-CM | POA: Diagnosis not present

## 2023-10-10 DIAGNOSIS — Z794 Long term (current) use of insulin: Secondary | ICD-10-CM | POA: Diagnosis not present

## 2023-10-10 DIAGNOSIS — Y929 Unspecified place or not applicable: Secondary | ICD-10-CM | POA: Diagnosis not present

## 2023-10-10 DIAGNOSIS — I10 Essential (primary) hypertension: Secondary | ICD-10-CM | POA: Diagnosis not present

## 2023-10-10 DIAGNOSIS — E1142 Type 2 diabetes mellitus with diabetic polyneuropathy: Secondary | ICD-10-CM | POA: Diagnosis not present

## 2023-10-10 DIAGNOSIS — G4733 Obstructive sleep apnea (adult) (pediatric): Secondary | ICD-10-CM | POA: Diagnosis not present

## 2023-10-10 DIAGNOSIS — T82110D Breakdown (mechanical) of cardiac electrode, subsequent encounter: Secondary | ICD-10-CM | POA: Diagnosis not present

## 2023-10-10 DIAGNOSIS — I2510 Atherosclerotic heart disease of native coronary artery without angina pectoris: Secondary | ICD-10-CM | POA: Diagnosis not present

## 2023-10-10 DIAGNOSIS — Y712 Prosthetic and other implants, materials and accessory cardiovascular devices associated with adverse incidents: Secondary | ICD-10-CM | POA: Diagnosis not present

## 2023-10-10 DIAGNOSIS — E039 Hypothyroidism, unspecified: Secondary | ICD-10-CM | POA: Diagnosis not present

## 2023-10-11 DIAGNOSIS — M25551 Pain in right hip: Secondary | ICD-10-CM | POA: Diagnosis not present

## 2023-10-13 DIAGNOSIS — G4733 Obstructive sleep apnea (adult) (pediatric): Secondary | ICD-10-CM | POA: Diagnosis not present

## 2023-10-14 DIAGNOSIS — M25551 Pain in right hip: Secondary | ICD-10-CM | POA: Diagnosis not present

## 2023-10-15 DIAGNOSIS — M6289 Other specified disorders of muscle: Secondary | ICD-10-CM | POA: Diagnosis not present

## 2023-10-15 DIAGNOSIS — Z6826 Body mass index (BMI) 26.0-26.9, adult: Secondary | ICD-10-CM | POA: Diagnosis not present

## 2023-10-15 DIAGNOSIS — M25551 Pain in right hip: Secondary | ICD-10-CM | POA: Diagnosis not present

## 2023-10-16 DIAGNOSIS — M25551 Pain in right hip: Secondary | ICD-10-CM | POA: Diagnosis not present

## 2023-10-18 ENCOUNTER — Other Ambulatory Visit (HOSPITAL_BASED_OUTPATIENT_CLINIC_OR_DEPARTMENT_OTHER): Payer: Self-pay | Admitting: Family Medicine

## 2023-10-18 DIAGNOSIS — M25551 Pain in right hip: Secondary | ICD-10-CM

## 2023-10-21 DIAGNOSIS — M25551 Pain in right hip: Secondary | ICD-10-CM | POA: Diagnosis not present

## 2023-10-23 DIAGNOSIS — M25551 Pain in right hip: Secondary | ICD-10-CM | POA: Diagnosis not present

## 2023-11-05 DIAGNOSIS — Z8719 Personal history of other diseases of the digestive system: Secondary | ICD-10-CM | POA: Diagnosis not present

## 2023-11-05 DIAGNOSIS — T82110A Breakdown (mechanical) of cardiac electrode, initial encounter: Secondary | ICD-10-CM | POA: Diagnosis not present

## 2023-11-05 DIAGNOSIS — Z01818 Encounter for other preprocedural examination: Secondary | ICD-10-CM | POA: Diagnosis not present

## 2023-11-05 DIAGNOSIS — Z9049 Acquired absence of other specified parts of digestive tract: Secondary | ICD-10-CM | POA: Diagnosis not present

## 2023-11-05 DIAGNOSIS — I10 Essential (primary) hypertension: Secondary | ICD-10-CM | POA: Diagnosis not present

## 2023-11-05 DIAGNOSIS — I495 Sick sinus syndrome: Secondary | ICD-10-CM | POA: Diagnosis not present

## 2023-11-05 DIAGNOSIS — Z79890 Hormone replacement therapy (postmenopausal): Secondary | ICD-10-CM | POA: Diagnosis not present

## 2023-11-05 DIAGNOSIS — Z86711 Personal history of pulmonary embolism: Secondary | ICD-10-CM | POA: Diagnosis not present

## 2023-11-05 DIAGNOSIS — Y712 Prosthetic and other implants, materials and accessory cardiovascular devices associated with adverse incidents: Secondary | ICD-10-CM | POA: Diagnosis not present

## 2023-11-05 DIAGNOSIS — G4733 Obstructive sleep apnea (adult) (pediatric): Secondary | ICD-10-CM | POA: Diagnosis not present

## 2023-11-05 DIAGNOSIS — I2081 Angina pectoris with coronary microvascular dysfunction: Secondary | ICD-10-CM | POA: Diagnosis not present

## 2023-11-05 DIAGNOSIS — E785 Hyperlipidemia, unspecified: Secondary | ICD-10-CM | POA: Diagnosis not present

## 2023-11-05 DIAGNOSIS — E1140 Type 2 diabetes mellitus with diabetic neuropathy, unspecified: Secondary | ICD-10-CM | POA: Diagnosis not present

## 2023-11-05 DIAGNOSIS — E039 Hypothyroidism, unspecified: Secondary | ICD-10-CM | POA: Diagnosis not present

## 2023-11-05 DIAGNOSIS — Z45018 Encounter for adjustment and management of other part of cardiac pacemaker: Secondary | ICD-10-CM | POA: Diagnosis not present

## 2023-11-05 DIAGNOSIS — I4510 Unspecified right bundle-branch block: Secondary | ICD-10-CM | POA: Diagnosis not present

## 2023-11-11 DIAGNOSIS — I4510 Unspecified right bundle-branch block: Secondary | ICD-10-CM | POA: Diagnosis not present

## 2023-11-11 DIAGNOSIS — I495 Sick sinus syndrome: Secondary | ICD-10-CM | POA: Diagnosis not present

## 2023-11-11 DIAGNOSIS — Y712 Prosthetic and other implants, materials and accessory cardiovascular devices associated with adverse incidents: Secondary | ICD-10-CM | POA: Diagnosis not present

## 2023-11-11 DIAGNOSIS — T82110D Breakdown (mechanical) of cardiac electrode, subsequent encounter: Secondary | ICD-10-CM | POA: Diagnosis not present

## 2023-11-11 DIAGNOSIS — Z79899 Other long term (current) drug therapy: Secondary | ICD-10-CM | POA: Diagnosis not present

## 2023-11-11 DIAGNOSIS — T82110A Breakdown (mechanical) of cardiac electrode, initial encounter: Secondary | ICD-10-CM | POA: Diagnosis not present

## 2023-11-12 DIAGNOSIS — Y712 Prosthetic and other implants, materials and accessory cardiovascular devices associated with adverse incidents: Secondary | ICD-10-CM | POA: Diagnosis not present

## 2023-11-12 DIAGNOSIS — Z79899 Other long term (current) drug therapy: Secondary | ICD-10-CM | POA: Diagnosis not present

## 2023-11-12 DIAGNOSIS — I4510 Unspecified right bundle-branch block: Secondary | ICD-10-CM | POA: Diagnosis not present

## 2023-11-12 DIAGNOSIS — Z950 Presence of cardiac pacemaker: Secondary | ICD-10-CM | POA: Diagnosis not present

## 2023-11-12 DIAGNOSIS — I495 Sick sinus syndrome: Secondary | ICD-10-CM | POA: Diagnosis not present

## 2023-11-12 DIAGNOSIS — I499 Cardiac arrhythmia, unspecified: Secondary | ICD-10-CM | POA: Diagnosis not present

## 2023-11-12 DIAGNOSIS — T82110A Breakdown (mechanical) of cardiac electrode, initial encounter: Secondary | ICD-10-CM | POA: Diagnosis not present

## 2023-11-14 DIAGNOSIS — Z794 Long term (current) use of insulin: Secondary | ICD-10-CM | POA: Diagnosis not present

## 2023-11-18 ENCOUNTER — Telehealth: Payer: Self-pay

## 2023-11-18 ENCOUNTER — Other Ambulatory Visit (HOSPITAL_COMMUNITY): Payer: Self-pay

## 2023-11-18 NOTE — Telephone Encounter (Signed)
 2026 Renewal  PAP: Patient assistance application for Farxiga through [employer] ([employer]) has been mailed to pt's home address on file. Provider portion of application will be faxed to provider's office.   PAP: Patient assistance application for Tresiba  through [employer] has been mailed to pt's home address on file. Provider portion of application will be faxed to provider's office.  Provider portion of application has been faxed to Dr. Tracey Bathe at [facility]

## 2023-11-20 DIAGNOSIS — Z950 Presence of cardiac pacemaker: Secondary | ICD-10-CM | POA: Diagnosis not present

## 2023-11-20 DIAGNOSIS — E785 Hyperlipidemia, unspecified: Secondary | ICD-10-CM | POA: Diagnosis not present

## 2023-11-20 DIAGNOSIS — Z6826 Body mass index (BMI) 26.0-26.9, adult: Secondary | ICD-10-CM | POA: Diagnosis not present

## 2023-11-20 DIAGNOSIS — E1169 Type 2 diabetes mellitus with other specified complication: Secondary | ICD-10-CM | POA: Diagnosis not present

## 2023-11-21 NOTE — Telephone Encounter (Signed)
 Received provider portion of patient assistance application

## 2023-11-22 DIAGNOSIS — I495 Sick sinus syndrome: Secondary | ICD-10-CM | POA: Diagnosis not present

## 2023-11-22 DIAGNOSIS — T82110S Breakdown (mechanical) of cardiac electrode, sequela: Secondary | ICD-10-CM | POA: Diagnosis not present

## 2023-11-25 DIAGNOSIS — Z0120 Encounter for dental examination and cleaning without abnormal findings: Secondary | ICD-10-CM | POA: Diagnosis not present

## 2023-12-09 NOTE — Telephone Encounter (Signed)
 PAP: Application for Farxiga  has been submitted to [employer] ([employer]), via fax  PAP: Application for [place] has been submitted to [employer], via fax

## 2023-12-12 ENCOUNTER — Other Ambulatory Visit (HOSPITAL_COMMUNITY): Payer: Self-pay

## 2023-12-30 DIAGNOSIS — Z45018 Encounter for adjustment and management of other part of cardiac pacemaker: Secondary | ICD-10-CM | POA: Diagnosis not present

## 2023-12-30 DIAGNOSIS — I495 Sick sinus syndrome: Secondary | ICD-10-CM | POA: Diagnosis not present

## 2023-12-30 DIAGNOSIS — I4510 Unspecified right bundle-branch block: Secondary | ICD-10-CM | POA: Diagnosis not present

## 2023-12-30 DIAGNOSIS — I2510 Atherosclerotic heart disease of native coronary artery without angina pectoris: Secondary | ICD-10-CM | POA: Diagnosis not present

## 2023-12-31 DIAGNOSIS — Z950 Presence of cardiac pacemaker: Secondary | ICD-10-CM | POA: Diagnosis not present

## 2024-01-06 DIAGNOSIS — E119 Type 2 diabetes mellitus without complications: Secondary | ICD-10-CM | POA: Diagnosis not present

## 2024-01-07 DIAGNOSIS — L578 Other skin changes due to chronic exposure to nonionizing radiation: Secondary | ICD-10-CM | POA: Diagnosis not present

## 2024-01-07 DIAGNOSIS — L821 Other seborrheic keratosis: Secondary | ICD-10-CM | POA: Diagnosis not present

## 2024-01-07 DIAGNOSIS — C44622 Squamous cell carcinoma of skin of right upper limb, including shoulder: Secondary | ICD-10-CM | POA: Diagnosis not present

## 2024-01-08 NOTE — Telephone Encounter (Signed)
 Spoke with [employer] and they are requiring patient to send in proof of income.  Faxed tax form from last year to [employer]

## 2024-01-08 NOTE — Telephone Encounter (Signed)
 PAP: Patient assistance application for Farxiga has been approved by PAP Companies: [employer] from 02/06/2024 to 02/04/2025. Medication should be delivered to PAP Delivery: Home. For further shipping updates, please contact [employer] ([employer]) at [phone]. Patient ID is: [id number]

## 2024-01-10 NOTE — Telephone Encounter (Signed)
 PAP: Patient has been denied for patient assistance by [employer] due to patient above income threshold for program

## 2024-01-13 DIAGNOSIS — G4733 Obstructive sleep apnea (adult) (pediatric): Secondary | ICD-10-CM | POA: Diagnosis not present

## 2024-02-24 ENCOUNTER — Telehealth: Payer: Self-pay

## 2024-02-24 ENCOUNTER — Other Ambulatory Visit (HOSPITAL_COMMUNITY): Payer: Self-pay

## 2024-02-24 NOTE — Telephone Encounter (Signed)
 Completed application for [facility] patient assistance through [employer] and emailed to Tiffany to have patient and provider review and sign at upcoming appointment

## 2024-02-26 NOTE — Telephone Encounter (Signed)
 PAP: Application for Breztri has been submitted to [employer] ([employer]), via fax

## 2024-02-27 NOTE — Telephone Encounter (Signed)
 PAP: Patient assistance application for Breztri has been approved by PAP Companies: [employer] from 02/27/2024 to 02/04/2025. Medication should be delivered to PAP Delivery: Home. For further shipping updates, please contact [employer] ([employer]) at [phone]. Patient ID is: [id number]
